# Patient Record
Sex: Male | Born: 1935 | Race: White | Hispanic: No | Marital: Married | Smoking: Former smoker
Health system: Southern US, Community
[De-identification: ages and names within clinical notes are randomized; demographics above are authoritative.]

## PROBLEM LIST (undated history)

## (undated) DIAGNOSIS — I639 Cerebral infarction, unspecified: Secondary | ICD-10-CM

## (undated) DIAGNOSIS — I480 Paroxysmal atrial fibrillation: Secondary | ICD-10-CM

## (undated) DIAGNOSIS — K219 Gastro-esophageal reflux disease without esophagitis: Secondary | ICD-10-CM

## (undated) DIAGNOSIS — I5032 Chronic diastolic (congestive) heart failure: Secondary | ICD-10-CM

## (undated) DIAGNOSIS — R1310 Dysphagia, unspecified: Secondary | ICD-10-CM

## (undated) DIAGNOSIS — I10 Essential (primary) hypertension: Secondary | ICD-10-CM

## (undated) DIAGNOSIS — E785 Hyperlipidemia, unspecified: Secondary | ICD-10-CM

## (undated) DIAGNOSIS — R319 Hematuria, unspecified: Secondary | ICD-10-CM

## (undated) HISTORY — DX: Dysphagia, unspecified: R13.10

## (undated) HISTORY — DX: Hematuria, unspecified: R31.9

## (undated) HISTORY — DX: Paroxysmal atrial fibrillation: I48.0

## (undated) HISTORY — DX: Chronic diastolic (congestive) heart failure: I50.32

## (undated) HISTORY — DX: Hyperlipidemia, unspecified: E78.5

---

## 2000-12-09 HISTORY — PX: HIP SURGERY: SHX245

## 2003-12-10 HISTORY — PX: SHOULDER SURGERY: SHX246

## 2005-04-30 ENCOUNTER — Ambulatory Visit: Payer: Self-pay | Admitting: Gastroenterology

## 2005-08-16 ENCOUNTER — Ambulatory Visit: Payer: Self-pay | Admitting: Family Medicine

## 2006-07-21 ENCOUNTER — Ambulatory Visit: Payer: Self-pay | Admitting: General Practice

## 2009-08-11 ENCOUNTER — Emergency Department: Payer: Self-pay | Admitting: Emergency Medicine

## 2009-08-12 ENCOUNTER — Emergency Department: Payer: Self-pay | Admitting: Internal Medicine

## 2012-10-21 ENCOUNTER — Emergency Department (HOSPITAL_COMMUNITY): Payer: PRIVATE HEALTH INSURANCE

## 2012-10-21 ENCOUNTER — Encounter (HOSPITAL_COMMUNITY): Payer: Self-pay

## 2012-10-21 ENCOUNTER — Inpatient Hospital Stay (HOSPITAL_COMMUNITY): Payer: PRIVATE HEALTH INSURANCE

## 2012-10-21 ENCOUNTER — Inpatient Hospital Stay (HOSPITAL_COMMUNITY)
Admission: EM | Admit: 2012-10-21 | Discharge: 2012-10-23 | DRG: 063 | Disposition: A | Discharge status: Home / Self Care | Payer: PRIVATE HEALTH INSURANCE | Attending: Neurology | Admitting: Neurology

## 2012-10-21 DIAGNOSIS — I6529 Occlusion and stenosis of unspecified carotid artery: Secondary | ICD-10-CM | POA: Diagnosis present

## 2012-10-21 DIAGNOSIS — I6350 Cerebral infarction due to unspecified occlusion or stenosis of unspecified cerebral artery: Principal | ICD-10-CM | POA: Diagnosis present

## 2012-10-21 DIAGNOSIS — R319 Hematuria, unspecified: Secondary | ICD-10-CM | POA: Diagnosis not present

## 2012-10-21 DIAGNOSIS — R1310 Dysphagia, unspecified: Secondary | ICD-10-CM | POA: Diagnosis present

## 2012-10-21 DIAGNOSIS — Z79899 Other long term (current) drug therapy: Secondary | ICD-10-CM

## 2012-10-21 DIAGNOSIS — R4701 Aphasia: Secondary | ICD-10-CM | POA: Diagnosis present

## 2012-10-21 DIAGNOSIS — R29810 Facial weakness: Secondary | ICD-10-CM | POA: Diagnosis present

## 2012-10-21 DIAGNOSIS — I10 Essential (primary) hypertension: Secondary | ICD-10-CM | POA: Diagnosis present

## 2012-10-21 DIAGNOSIS — Z96649 Presence of unspecified artificial hip joint: Secondary | ICD-10-CM

## 2012-10-21 DIAGNOSIS — K219 Gastro-esophageal reflux disease without esophagitis: Secondary | ICD-10-CM | POA: Diagnosis present

## 2012-10-21 DIAGNOSIS — I639 Cerebral infarction, unspecified: Secondary | ICD-10-CM | POA: Diagnosis present

## 2012-10-21 DIAGNOSIS — R911 Solitary pulmonary nodule: Secondary | ICD-10-CM | POA: Diagnosis present

## 2012-10-21 DIAGNOSIS — E785 Hyperlipidemia, unspecified: Secondary | ICD-10-CM | POA: Diagnosis present

## 2012-10-21 DIAGNOSIS — R4789 Other speech disturbances: Secondary | ICD-10-CM | POA: Diagnosis present

## 2012-10-21 HISTORY — DX: Essential (primary) hypertension: I10

## 2012-10-21 HISTORY — DX: Gastro-esophageal reflux disease without esophagitis: K21.9

## 2012-10-21 LAB — COMPREHENSIVE METABOLIC PANEL
ALT: 10 U/L (ref 0–53)
AST: 25 U/L (ref 0–37)
Albumin: 4.1 g/dL (ref 3.5–5.2)
Alkaline Phosphatase: 45 U/L (ref 39–117)
Potassium: 3.8 mEq/L (ref 3.5–5.1)
Sodium: 136 mEq/L (ref 135–145)
Total Protein: 7.3 g/dL (ref 6.0–8.3)

## 2012-10-21 LAB — DIFFERENTIAL
Basophils Relative: 1 % (ref 0–1)
Eosinophils Absolute: 0.2 10*3/uL (ref 0.0–0.7)
Neutrophils Relative %: 65 % (ref 43–77)

## 2012-10-21 LAB — POCT I-STAT, CHEM 8
BUN: 17 mg/dL (ref 6–23)
Calcium, Ion: 1.16 mmol/L (ref 1.13–1.30)
Chloride: 101 mEq/L (ref 96–112)
Glucose, Bld: 104 mg/dL — ABNORMAL HIGH (ref 70–99)

## 2012-10-21 LAB — CBC
MCH: 30.8 pg (ref 26.0–34.0)
MCHC: 34.8 g/dL (ref 30.0–36.0)
Platelets: 165 10*3/uL (ref 150–400)
RBC: 4.74 MIL/uL (ref 4.22–5.81)

## 2012-10-21 LAB — APTT: aPTT: 36 seconds (ref 24–37)

## 2012-10-21 LAB — PROTIME-INR
INR: 1.14 (ref 0.00–1.49)
Prothrombin Time: 14.4 seconds (ref 11.6–15.2)

## 2012-10-21 LAB — GLUCOSE, CAPILLARY: Glucose-Capillary: 102 mg/dL — ABNORMAL HIGH (ref 70–99)

## 2012-10-21 MED ORDER — ACETAMINOPHEN 325 MG PO TABS: 650.0000 mg | ORAL_TABLET | ORAL | Status: DC | PRN

## 2012-10-21 MED ORDER — SODIUM CHLORIDE 0.9 % IV SOLN
250.0000 mL | Freq: Once | INTRAVENOUS | Status: AC
  Administered 2012-10-21: 250 mL via INTRAVENOUS

## 2012-10-21 MED ORDER — PANTOPRAZOLE SODIUM 40 MG IV SOLR
40.0000 mg | Freq: Every day | INTRAVENOUS | Status: DC
  Administered 2012-10-21: 40 mg via INTRAVENOUS
  Filled 2012-10-21 (×2): qty 40

## 2012-10-21 MED ORDER — LABETALOL HCL 5 MG/ML IV SOLN
10.0000 mg | INTRAVENOUS | Status: DC | PRN
  Filled 2012-10-21: qty 4

## 2012-10-21 MED ORDER — DIPHENHYDRAMINE HCL 50 MG/ML IJ SOLN
50.0000 mg | Freq: Once | INTRAMUSCULAR | Status: AC
  Administered 2012-10-21: 50 mg via INTRAVENOUS

## 2012-10-21 MED ORDER — METHYLPREDNISOLONE SODIUM SUCC 125 MG IJ SOLR
INTRAMUSCULAR | Status: AC
  Filled 2012-10-21: qty 2

## 2012-10-21 MED ORDER — ALTEPLASE (STROKE) FULL DOSE INFUSION
0.9000 mg/kg | Freq: Once | INTRAVENOUS | Status: AC
  Administered 2012-10-21: 59 mg via INTRAVENOUS
  Filled 2012-10-21: qty 59

## 2012-10-21 MED ORDER — SENNOSIDES-DOCUSATE SODIUM 8.6-50 MG PO TABS
1.0000 | ORAL_TABLET | Freq: Every evening | ORAL | Status: DC | PRN
  Filled 2012-10-21: qty 1

## 2012-10-21 MED ORDER — SODIUM CHLORIDE 0.9 % IV SOLN
INTRAVENOUS | Status: DC
Start: 2012-10-21 — End: 2012-10-23
  Administered 2012-10-22: 1000 mL via INTRAVENOUS
  Administered 2012-10-23: 01:00:00 via INTRAVENOUS

## 2012-10-21 MED ORDER — DIPHENHYDRAMINE HCL 50 MG/ML IJ SOLN
INTRAMUSCULAR | Status: AC
  Filled 2012-10-21: qty 1

## 2012-10-21 MED ORDER — ACETAMINOPHEN 650 MG RE SUPP: 650.0000 mg | RECTAL | Status: DC | PRN

## 2012-10-21 MED ORDER — METHYLPREDNISOLONE SODIUM SUCC 125 MG IJ SOLR
125.0000 mg | Freq: Once | INTRAMUSCULAR | Status: AC
  Administered 2012-10-21: 125 mg via INTRAVENOUS

## 2012-10-21 MED ORDER — ONDANSETRON HCL 4 MG/2ML IJ SOLN: 4.0000 mg | Freq: Four times a day (QID) | INTRAMUSCULAR | Status: DC | PRN
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
## 2012-10-21 NOTE — Progress Notes (Signed)
*  PRELIMINARY RESULTS* Vascular Ultrasound Carotid Duplex (Doppler) has been completed.  There is evidence of elevated right internal carotid artery velocities in the upper 40-59% range of stenosis. The right vertebral artery is patent and demonstrates an atypical to-fro/ "bunny sign" waveform. The left internal carotid artery is highly tortuous with no obvious evidence of hemodynamically significant stenosis.  10/21/2012 7:03 PM Gertie Fey, RDMS, RDCS

## 2012-10-21 NOTE — Progress Notes (Signed)
RN bedside to empty and assess foley. Urine bag is filled with pink urine. MD notified of blood in urine. No new orders at this time.

## 2012-10-21 NOTE — ED Notes (Addendum)
tPa stopped with 7mL left d/t oral edema. 52mL of tPa administered.

## 2012-10-21 NOTE — ED Notes (Signed)
Tongue swelling noted, [place] [facility] notified, Dr. Roseanne Reno paged.
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
## 2012-10-22 ENCOUNTER — Inpatient Hospital Stay (HOSPITAL_COMMUNITY): Payer: PRIVATE HEALTH INSURANCE

## 2012-10-22 DIAGNOSIS — E785 Hyperlipidemia, unspecified: Secondary | ICD-10-CM | POA: Diagnosis present

## 2012-10-22 DIAGNOSIS — I639 Cerebral infarction, unspecified: Secondary | ICD-10-CM | POA: Diagnosis present

## 2012-10-22 DIAGNOSIS — I6789 Other cerebrovascular disease: Secondary | ICD-10-CM

## 2012-10-22 DIAGNOSIS — I6529 Occlusion and stenosis of unspecified carotid artery: Secondary | ICD-10-CM | POA: Diagnosis present

## 2012-10-22 DIAGNOSIS — I10 Essential (primary) hypertension: Secondary | ICD-10-CM | POA: Diagnosis present

## 2012-10-22 DIAGNOSIS — R319 Hematuria, unspecified: Secondary | ICD-10-CM | POA: Diagnosis not present

## 2012-10-22 DIAGNOSIS — R1310 Dysphagia, unspecified: Secondary | ICD-10-CM | POA: Diagnosis present

## 2012-10-22 DIAGNOSIS — R911 Solitary pulmonary nodule: Secondary | ICD-10-CM | POA: Diagnosis present

## 2012-10-22 DIAGNOSIS — I6350 Cerebral infarction due to unspecified occlusion or stenosis of unspecified cerebral artery: Principal | ICD-10-CM

## 2012-10-22 LAB — LIPID PANEL
HDL: 47 mg/dL (ref >39)
LDL Cholesterol: 105 mg/dL — ABNORMAL HIGH (ref 0–99)
Triglycerides: 51 mg/dL (ref <150)
VLDL: 10 mg/dL (ref 0–40)

## 2012-10-22 MED ORDER — SIMVASTATIN 10 MG PO TABS
10.0000 mg | ORAL_TABLET | Freq: Every day | ORAL | Status: DC
  Administered 2012-10-22: 10 mg via ORAL
  Filled 2012-10-22 (×2): qty 1

## 2012-10-22 MED ORDER — CLOPIDOGREL BISULFATE 75 MG PO TABS
75.0000 mg | ORAL_TABLET | Freq: Every day | ORAL | Status: DC
  Administered 2012-10-22 – 2012-10-23 (×2): 75 mg via ORAL
  Filled 2012-10-22 (×4): qty 1

## 2012-10-22 NOTE — Evaluation (Signed)
Physical Therapy Evaluation Patient Details Name: Norman Dickerson MRN: [id number] DOB: 10-02-1920 Today's Date: 10/22/2012 Time: 4098-1191 PT Time Calculation (min): 26 min  PT Assessment / Plan / Recommendation Clinical Impression  Pt is 78 y/o male admitted for right facial weakness and received tPA.  Pt with mild unsteady gait however pt fatigued from overall procedures today.  Noticeable slurred speech throughout session.  Pt will benefit from acute PT services to improve overall mobility, further balance and DME need assessment.  Pt overall fatigue on evaluation however willing to work.  Pt will benefit from acute PT services to improve overall mobility and prepare for safe d/c home with family.    PT Assessment  Patient needs continued PT services    Follow Up Recommendations  Home health PT;Supervision/Assistance - 24 hour (will continue to assess)    Does the patient have the potential to tolerate intense rehabilitation      Barriers to Discharge None      Equipment Recommendations   (will continue to assess may need RW intially)    Recommendations for Other Services     Frequency Min 4X/week    Precautions / Restrictions Precautions Precautions: Fall   Pertinent Vitals/Pain No c/o pain      Mobility  Bed Mobility Bed Mobility: Supine to Sit Supine to Sit: 4: Min guard;HOB flat;With rails Details for Bed Mobility Assistance: Minguard for safety Transfers Transfers: Sit to Stand;Stand to Sit;Stand Pivot Transfers Sit to Stand: 4: Min guard;From bed;From chair/3-in-1 Stand to Sit: 4: Min guard;To bed;To chair/3-in-1 Stand Pivot Transfers: 4: Min assist Details for Transfer Assistance: (A) with transfer to maintain balance.  Pt tends to be unsteady with steps with posterior lean. Ambulation/Gait Ambulation/Gait Assistance: 1: +2 Total assist Ambulation/Gait: Patient Percentage: 80% Ambulation Distance (Feet): 40 Feet Assistive device: 2 person hand held  assist Ambulation/Gait Assistance Details: +2 (A) for balance, safety and lines.  Pt unsteady with wide BOS initially.   Gait Pattern: Step-through pattern;Shuffle;Trunk flexed;Wide base of support Stairs: No    Shoulder Instructions     Exercises     PT Diagnosis: Difficulty walking;Abnormality of gait;Generalized weakness  PT Problem List: Decreased strength;Decreased balance;Decreased mobility;Decreased activity tolerance;Decreased knowledge of use of DME PT Treatment Interventions: DME instruction;Gait training;Stair training;Functional mobility training;Therapeutic activities;Therapeutic exercise;Balance training;Neuromuscular re-education;Patient/family education   PT Goals Acute Rehab PT Goals PT Goal Formulation: With patient/family Time For Goal Achievement: 10/29/12 Potential to Achieve Goals: Good Pt will go Supine/Side to Sit: with modified independence PT Goal: Supine/Side to Sit - Progress: Goal set today Pt will go Sit to Supine/Side: with modified independence PT Goal: Sit to Supine/Side - Progress: Goal set today Pt will go Sit to Stand: with modified independence PT Goal: Sit to Stand - Progress: Goal set today Pt will go Stand to Sit: with modified independence PT Goal: Stand to Sit - Progress: Goal set today Pt will Ambulate: >150 feet;with least restrictive assistive device;with modified independence PT Goal: Ambulate - Progress: Goal set today Pt will Go Up / Down Stairs: 1-2 stairs;with modified independence;with least restrictive assistive device PT Goal: Up/Down Stairs - Progress: Goal set today  Visit Information  Last PT Received On: 10/22/12 Assistance Needed: +1    Subjective Data  Subjective: "I'm pretty sleepy right now.: Patient Stated Goal: To go home with family   Prior Functioning  Home Living Lives With: Spouse Available Help at Discharge: Family;Available 24 hours/day Type of Home: House Home Access: Stairs to enter [employer] of Steps:  2 Entrance Stairs-Rails: None Home Layout: One level (with basement (pt walks down for pantry)) Bathroom Shower/Tub: Tub/shower unit;Door [employer] Toilet: Handicapped height Bathroom Accessibility: Yes How Accessible: Accessible via walker Home Adaptive Equipment: None (wife has equipment at home but pt has no equipment) Additional Comments: wife is there 24hr/day however only able to provide supervision.  Son present and stated "We all can work something out if we need more assistance." Prior Function Level of Independence: Independent Able to Take Stairs?: Yes Driving: Yes Vocation: Retired Musician:  (noticeable slurred speech) Dominant Hand: Right    Cognition  Overall Cognitive Status: Appears within functional limits for tasks assessed/performed Arousal/Alertness: Awake/alert (However noticeable a little drowsy) Orientation Level: Appears intact for tasks assessed Behavior During Session: [facility] for tasks performed    Extremity/Trunk Assessment Right Lower Extremity Assessment RLE ROM/Strength/Tone: Within functional levels Left Lower Extremity Assessment LLE ROM/Strength/Tone: Within functional levels   Balance Balance Balance Assessed: Yes Static Sitting Balance Static Sitting - Balance Support: Feet supported Static Sitting - Level of Assistance: 6: Modified independent (Device/Increase time)  End of Session PT - End of Session Equipment Utilized During Treatment: Gait belt Activity Tolerance: Patient tolerated treatment well Patient left: in chair;with call bell/phone within reach;with family/visitor present Nurse Communication: Mobility status  GP     Yi Falletta 10/22/2012, 12:20 PM [place], PT DPT [phone]

## 2012-10-22 NOTE — Procedures (Signed)
Objective Swallowing Evaluation: Modified Barium Swallowing Study  Patient Details  Name: Norman Dickerson MRN: [id number] Date of Birth: 06/18/1920  Today's Date: 10/22/2012 Time: 1035-1100 SLP Time Calculation (min): 25 min  Past Medical History:  Past Medical History  Diagnosis Date  . GERD (gastroesophageal reflux disease)   . Hypertension    Past Surgical History: History reviewed. No pertinent past surgical history. HPI:  78 y.o. male with history of GERD and HTN. Patient did not take his medication for his HTN this am.  He was driving with his wife today when at 12:00 noon his wife noted he had a right facial droop.  EMS was called and found patient dysarthric and notable right facial droop. NIHSS 3. CT of head negative. MRI pending.      Assessment / Plan / Recommendation Clinical Impression  Dysphagia Diagnosis: Moderate oral phase dysphagia;Moderate pharyngeal phase dysphagia;Severe pharyngeal phase dysphagia;Moderate cervical esophageal phase dysphagia Clinical impression: Patient presents with a moderate-severe oropharyngeal dysphagia with sensory, motor, and anatomical components. Overall function characterized by prolonged oral transit of solids, delayed swallow initiation, and oral, laryngeal, and pharyngeal weakness resulting in penetration of thin liquids during the swallow, and of all liquids post swallow due to pharyngeal residuals which are moderate-severe in nature. Cued hard throat clear and dry swallow was consistently effective to clear penetrates from the laryngeal vestibule, preventing aspiration during today's exam, although risk remains moderately high. Additionally, cannot f/u anatomical component to degree of pharyngeal residuals secondary to what appears to be osteophytes located along cervical spine beginning at C2-MD not present to confirm. ? some degree of pre-admission dysphagia which has now been exacerbated by acute weakness related to CVA.  Discussed in depth  with patient. Plan to continue a po diet, dysphagia 3, thin liquids, with strict aspiration precautions to decrease risk.  SLP will f/u closely.     Treatment Recommendation  Therapy as outlined in treatment plan below    Diet Recommendation Dysphagia 3 (Mechanical Soft);Thin liquid   Liquid Administration via: Cup;No straw Medication Administration: Crushed with puree Supervision: Patient able to self feed;Full supervision/cueing for compensatory strategies Compensations: Slow rate;Small sips/bites (throat clear and dry swallow after each bite/sip) Postural Changes and/or Swallow Maneuvers: Seated upright 90 degrees    Other  Recommendations Recommended Consults: MBS Oral Care Recommendations: Oral care BID   Follow Up Recommendations  Outpatient SLP    Frequency and Duration min 3x week  2 weeks   Pertinent Vitals/Pain n/a    SLP Swallow Goals Patient will utilize recommended strategies during swallow to increase swallowing safety with: Modified independent assistance Swallow Study Goal #2 - Progress: Not met   General HPI: 78 y.o. male with history of GERD and HTN. Patient did not take his medication for his HTN this am.  He was driving with his wife today when at 12:00 noon his wife noted he had a right facial droop.  EMS was called and found patient dysarthric and notable right facial droop. NIHSS 3. CT of head negative. MRI pending.  Type of Study: Modified Barium Swallowing Study Reason for Referral: Objectively evaluate swallowing function Previous Swallow Assessment: none; patient passed the RN stroke swallow screen however during cognitive-linguistic evaluation, vocal quality noted to be wet during am meal intake.  Diet Prior to this Study: Regular;Thin liquids Temperature Spikes Noted: No Respiratory Status: Room air History of Recent Intubation: No Behavior/Cognition: Alert;Cooperative;Pleasant mood Oral Cavity - Dentition: Adequate natural dentition Oral Motor /  Sensory Function: Impaired - see Bedside  swallow eval Self-Feeding Abilities: Able to feed self Patient Positioning: Upright in chair Baseline Vocal Quality: Wet Volitional Cough: Strong Volitional Swallow: Able to elicit Anatomy: Other (Comment) (? osteophytes from C2 down; MD not present to confirm) Pharyngeal Secretions: Not observed secondary MBS    Reason for Referral Objectively evaluate swallowing function   Oral Phase Oral Preparation/Oral Phase Oral Phase: Impaired Oral - Nectar Oral - Nectar Teaspoon: Within functional limits Oral - Nectar Cup: Within functional limits Oral - Thin Oral - Thin Cup: Within functional limits Oral - Solids Oral - Puree: Delayed oral transit Oral - Mechanical Soft: Delayed oral transit   Pharyngeal Phase Pharyngeal Phase Pharyngeal Phase: Impaired Pharyngeal - Nectar Pharyngeal - Nectar Teaspoon: Delayed swallow initiation;Premature spillage to pyriform sinuses;Reduced pharyngeal peristalsis;Reduced anterior laryngeal mobility;Reduced laryngeal elevation;Reduced airway/laryngeal closure;Reduced tongue base retraction;Penetration/Aspiration after swallow;Pharyngeal residue - valleculae;Pharyngeal residue - pyriform sinuses Penetration/Aspiration details (nectar teaspoon): Material enters airway, CONTACTS cords and not ejected out Pharyngeal - Nectar Cup: Delayed swallow initiation;Premature spillage to pyriform sinuses;Reduced pharyngeal peristalsis;Reduced anterior laryngeal mobility;Reduced laryngeal elevation;Reduced airway/laryngeal closure;Reduced tongue base retraction;Penetration/Aspiration after swallow;Pharyngeal residue - valleculae;Pharyngeal residue - pyriform sinuses Penetration/Aspiration details (nectar cup): Material enters airway, CONTACTS cords and not ejected out Pharyngeal - Thin Pharyngeal - Thin Cup: Delayed swallow initiation;Premature spillage to pyriform sinuses;Reduced pharyngeal peristalsis;Reduced anterior laryngeal  mobility;Reduced laryngeal elevation;Reduced airway/laryngeal closure;Reduced tongue base retraction;Penetration/Aspiration after swallow;Pharyngeal residue - valleculae;Pharyngeal residue - pyriform sinuses;Penetration/Aspiration during swallow Penetration/Aspiration details (thin cup): Material enters airway, remains ABOVE vocal cords and not ejected out Pharyngeal - Solids Pharyngeal - Puree: Delayed swallow initiation;Reduced pharyngeal peristalsis;Reduced anterior laryngeal mobility;Reduced laryngeal elevation;Reduced airway/laryngeal closure;Reduced tongue base retraction;Pharyngeal residue - valleculae;Pharyngeal residue - pyriform sinuses;Premature spillage to valleculae Penetration/Aspiration details (puree): Material does not enter airway Pharyngeal - Mechanical Soft: Delayed swallow initiation;Reduced pharyngeal peristalsis;Reduced anterior laryngeal mobility;Reduced laryngeal elevation;Reduced airway/laryngeal closure;Reduced tongue base retraction;Pharyngeal residue - valleculae;Pharyngeal residue - pyriform sinuses;Premature spillage to valleculae Penetration/Aspiration details (mechanical soft): Material does not enter airway  Cervical Esophageal Phase    GO    Cervical Esophageal Phase Cervical Esophageal Phase: Impaired Cervical Esophageal Phase - Nectar Nectar Teaspoon: Reduced cricopharyngeal relaxation Nectar Cup: Reduced cricopharyngeal relaxation Cervical Esophageal Phase - Thin Thin Cup: Reduced cricopharyngeal relaxation Cervical Esophageal Phase - Solids Puree: Reduced cricopharyngeal relaxation Mechanical Soft: Reduced cricopharyngeal relaxation Cervical Esophageal Phase - Comment Cervical Esophageal Comment: ? due to anatomical variations to cervical spine        [name] MA, CCC-SLP [phone]  Norman Dickerson 10/22/2012, 12:07 PM

## 2012-10-22 NOTE — Progress Notes (Signed)
OT Cancellation Note  Patient Details Name: Norman Dickerson MRN: [id number] DOB: 05/18/1920   Cancelled Treatment:     Pt declining OT evaluation today.  Report fatigue from multiple procedures today.  Will continue to follow.  Evern Bio 10/22/2012, 1:21 PM [id number]

## 2012-10-22 NOTE — Progress Notes (Signed)
Stroke Team Progress Note  HISTORY Norman Dickerson is an 78 y.o. male with history of GERD and HTN. Patient did not take his medication for his HTN this am. He was driving with his wife today 10/21/2012 when at 12:00 noon his wife noted he had a right facial droop. EMS was called and found patient dysarthric and notable right facial droop. BP was 190's systolically. PAtient was brought to [facility] hospital as a code stroke. While in the ED initially he scored a 1 for facial droop but then progressed to a 4 for facial droop, dysarthria and aphasia. tPa was ordered, administered and patient was admitted to 3100.  SUBJECTIVE His family is at the bedside.  Overall he feels his condition is stable/improved.   OBJECTIVE Most recent Vital Signs: Filed Vitals:   10/22/12 0400 10/22/12 0500 10/22/12 0600 10/22/12 0700  BP: 122/50 137/65 128/60 140/59  Pulse: 57 66 56 59  Temp: 97.9 F (36.6 C)     TempSrc: Oral     Resp: 13 15 16 15   Height:      Weight:      SpO2: 97% 97% 97% 95%   CBG (last 3)   Basename 10/21/12 1319  GLUCAP 102*    IV Fluid Intake:     . sodium chloride 75 mL/hr at 10/22/12 0700    MEDICATIONS    . [COMPLETED] sodium chloride  250 mL Intravenous Once  . [COMPLETED] alteplase  0.9 mg/kg Intravenous Once  . [COMPLETED] diphenhydrAMINE  50 mg Intravenous Once  . [COMPLETED] methylPREDNISolone (SOLU-MEDROL) injection  125 mg Intravenous Once  . pantoprazole (PROTONIX) IV  40 mg Intravenous QHS   PRN:  acetaminophen, acetaminophen, labetalol, ondansetron (ZOFRAN) IV, senna-docusate  Diet:  Cardiac thin liquids Activity:  Bedrest DVT Prophylaxis:  SCDs   CLINICALLY SIGNIFICANT STUDIES Basic Metabolic Panel:  Lab 10/21/12 1610 10/21/12 1252  NA 138 136  K 3.9 3.8  CL 101 98  CO2 -- 27  GLUCOSE 104* 100*  BUN 17 16  CREATININE 1.20 1.08  CALCIUM -- 9.5  MG -- --  PHOS -- --   Liver Function Tests:  Lab 10/21/12 1252  AST 25  ALT 10  ALKPHOS 45  BILITOT 0.7    PROT 7.3  ALBUMIN 4.1   CBC:  Lab 10/21/12 1307 10/21/12 1252  WBC -- 6.3  NEUTROABS -- 4.1  HGB 14.6 14.6  HCT 43.0 42.0  MCV -- 88.6  PLT -- 165   Coagulation:  Lab 10/21/12 1252  LABPROT 14.4  INR 1.14   Cardiac Enzymes:  Lab 10/21/12 1252  CKTOTAL --  CKMB --  CKMBINDEX --  TROPONINI <0.30   Urinalysis: No results found for this basename: COLORURINE:2,APPERANCEUR:2,LABSPEC:2,PHURINE:2,GLUCOSEU:2,HGBUR:2,BILIRUBINUR:2,KETONESUR:2,PROTEINUR:2,UROBILINOGEN:2,NITRITE:2,LEUKOCYTESUR:2 in the last 168 hours Lipid Panel    Component Value Date/Time   CHOL 162 10/22/2012 0510   TRIG 51 10/22/2012 0510   HDL 47 10/22/2012 0510   CHOLHDL 3.4 10/22/2012 0510   VLDL 10 10/22/2012 0510   LDLCALC 105* 10/22/2012 0510   HgbA1C  No results found for this basename: HGBA1C    Urine Drug Screen:   No results found for this basename: labopia, cocainscrnur, labbenz, amphetmu, thcu, labbarb    Alcohol Level: No results found for this basename: ETH:2 in the last 168 hours   CT of the brain  10/21/2012  No evidence of acute intracranial abnormality.  Atrophy with small vessel ischemic changes and intracranial atherosclerosis.   MRI of the brain    MRA of the  brain    2D Echocardiogram    Carotid Doppler  There is evidence of elevated right internal carotid artery velocities in the upper 40-59% range of stenosis. The right vertebral artery is patent and demonstrates an atypical to-fro/ "bunny sign" waveform. The left internal carotid artery is highly tortuous with no obvious evidence of hemodynamically significant stenosis.  CXR  10/21/2012  Nodular density projects over the right mid lung.  Cannot exclude pulmonary nodule.  Consider further evaluation with chest CT when the patient is clinically able.      EKG  normal EKG, normal sinus rhythm, unchanged from previous tracings, normal sinus rhythm, occasional PVC noted, unifocal.   Therapy Recommendations PT - ; OT -    Physical Exam   Pleasant elderly Caucasian male currently not in distress.Awake alert. Afebrile. Head is nontraumatic. Neck is supple without bruit. Hearing is normal. Cardiac exam no murmur or gallop. Lungs are clear to auscultation. Distal pulses are well felt.  Neurological Exam : Awake  Alert oriented x 3. Normal speech and language.eye movements full without nystagmus. Fundi were not visualized. Vision acuity and fields appear normal.. Face asymmetric with mild right lower face weakness.. Tongue midline. Normal strength, tone, reflexes and coordination. Normal sensation. Gait deferred.  ASSESSMENT [name] is a 78 y.o. male presenting with right facial droop, dysarthria and aphasia. Status post IV t-PA 10/21/2012 at 1332. Suspect left brain stroke. Imaging pending. Etiology unknown at this time. Work up underway. On aspirin 81 mg orally every day prior to admission. Now on no antiplatelets as within 24h of tPA for secondary stroke prevention.    Dysphagia, secondary to stroke, ST assessment pending  Hematuria  Possible pulmonary nodule on CXR  R ICA stenosis 40-59%  R VA tortuosity Hypertension, 192/95 on admission Hyperlipidemia, LDL 105, not on statin PTA, goal LDL < 100  Hospital day # 1  TREATMENT/PLAN  Add clopidogrel 75 mg orally every day for secondary stroke prevention 24h post tpa if imaging negative for hemorrhage.  MRI, MRA at 1p  OOB. Therapy evals  Transfer to the floor this afternoon   This patient is critically ill and at significant risk of neurological worsening, death and care requires constant monitoring of vital signs, hemodynamics,respiratory and cardiac monitoring,review of multiple databases, neurological assessment, discussion with family, other specialists and medical decision making of high complexity. I spent 30 minutes of neurocritical care time  in the care of  this patient.   Annie Main, MSN, RN, ANVP-BC, ANP-BC, [facility] Pager: [phone] 10/22/2012 8:04 AM  Scribe for Dr. Delia Heady, Stroke Center Medical Director, who has personally reviewed chart, pertinent data, examined the patient and developed the plan of care. Pager:  [phone]

## 2012-10-22 NOTE — Evaluation (Signed)
Speech Language Pathology Evaluation Patient Details Name: Norman Dickerson MRN: [id number] DOB: 1920-05-03 Today's Date: 10/22/2012 Time: 6213-0865 SLP Time Calculation (min): 20 min  Problem List: There is no problem list on file for this patient.  Past Medical History:  Past Medical History  Diagnosis Date  . GERD (gastroesophageal reflux disease)   . Hypertension    Past Surgical History: History reviewed. No pertinent past surgical history. HPI:  78 y.o. male with history of GERD and HTN. Patient did not take his medication for his HTN this am.  He was driving with his wife today when at 12:00 noon his wife noted he had a right facial droop.  EMS was called and found patient dysarthric and notable right facial droop. NIHSS 3. CT of head negative. MRI pending.    Assessment / Plan / Recommendation Clinical Impression  Cognitive-linguistic evaluation complete. Expressive and receptive communication skills appear to have returned to baseline with the exception of mild dyslexia at the sentence level. Patient receptive to SLP verbal cueing and able to self correct in 90% of opportunitites. No f/u SLP treatment recommended in the acute care setting as communication skills functional however recommend HH vs OP SLP f/u after d/c given high baseline cognitive function and independence prior to admission.     SLP Assessment  All further Speech Lanaguage Pathology  needs can be addressed in the next venue of care    Follow Up Recommendations  Outpatient SLP (vs HH if OP not a possibility)       Pertinent Vitals/Pain n/a   SLP Goals  SLP Goals Progress/Goals/Alternative treatment plan discussed with pt/caregiver and they: Agree  SLP Evaluation Prior Functioning  Cognitive/Linguistic Baseline: Within functional limits Lives With: Spouse Vocation: Retired   IT consultant  Overall Cognitive Status: Appears within functional limits for tasks assessed Orientation Level: Oriented X4      Comprehension  Auditory Comprehension Overall Auditory Comprehension: Appears within functional limits for tasks assessed Interfering Components: Hearing (HOH; requires occassional repetition of information) EffectiveTechniques: Repetition Visual Recognition/Discrimination Discrimination: Within Function Limits Reading Comprehension Reading Status: Impaired Word level: Within functional limits Sentence Level: Impaired Paragraph Level: Impaired Functional Environmental (signs, name badge): Within functional limits Effective Techniques: Visual cueing;Verbal cueing    Expression Expression Primary Mode of Expression: Verbal Verbal Expression Overall Verbal Expression: Appears within functional limits for tasks assessed Written Expression Dominant Hand: Right Written Expression: Not tested   Oral / Motor Oral Motor/Sensory Function Overall Oral Motor/Sensory Function: Impaired Labial ROM: Reduced right Labial Symmetry: Abnormal symmetry right Labial Strength: Reduced Labial Sensation: Within Functional Limits Lingual ROM: Within Functional Limits Lingual Symmetry: Within Functional Limits Lingual Strength: Within Functional Limits Lingual Sensation: Within Functional Limits Facial ROM: Reduced right Facial Symmetry: Right droop (slight) Facial Strength: Within Functional Limits Facial Sensation: Within Functional Limits Velum: Within Functional Limits Mandible: Within Functional Limits Motor Speech Overall Motor Speech: Appears within functional limits for tasks assessed   GO   [name] MA, CCC-SLP [phone]   [place] Doig [name] 10/22/2012, 9:22 AM

## 2012-10-22 NOTE — Progress Notes (Signed)
  Echocardiogram 2D Echocardiogram has been performed.  Georgian Co 10/22/2012, 4:46 PM

## 2012-10-22 NOTE — Evaluation (Signed)
Clinical/Bedside Swallow Evaluation Patient Details  Name: Norman Dickerson MRN: [id number] Date of Birth: 1920-10-31  Today's Date: 10/22/2012 Time: 0850-0905 SLP Time Calculation (min): 15 min  Past Medical History:  Past Medical History  Diagnosis Date  . GERD (gastroesophageal reflux disease)   . Hypertension    Past Surgical History: History reviewed. No pertinent past surgical history. HPI:  78 y.o. male with history of GERD and HTN. Patient did not take his medication for his HTN this am.  He was driving with his wife today when at 12:00 noon his wife noted he had a right facial droop.  EMS was called and found patient dysarthric and notable right facial droop. NIHSS 3. CT of head negative. MRI pending.    Assessment / Plan / Recommendation Clinical Impression  Patient presents with subtle s/s of aspiration characterized by intermittent wet vocal quality post swallow during am meal without patient awareness suggestive of penetration and/or aspiration. Given mild residual right sided facial droop due to acute CVA, recommend proceeding with MBS this am to ensure patient consuming least restrictive diet. Will continue current diet at this time. Educated patient and spouse regarding general safe swallowing precautions to utilize prior to exam. MBS scheduled for 1030.     Aspiration Risk  Moderate    Diet Recommendation Regular;Thin liquid   Liquid Administration via: Cup Medication Administration: Whole meds with liquid Supervision: Patient able to self feed;Intermittent supervision to cue for compensatory strategies Compensations: Slow rate;Small sips/bites Postural Changes and/or Swallow Maneuvers: Seated upright 90 degrees    Other  Recommendations Recommended Consults: MBS Oral Care Recommendations: Oral care BID   Follow Up Recommendations   (TBD for dysphagia pending MBS)       Pertinent Vitals/Pain n/a       Swallow Study Prior Functional Status  Cognitive/Linguistic Baseline: Within functional limits Lives 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 male with history of GERD and HTN. Patient did not take his medication for his HTN this am.  He was driving with his wife today when at 12:00 noon his wife noted he had a right facial droop.  EMS was called and found patient dysarthric and notable right facial droop. NIHSS 3. CT of head negative. MRI pending.  Type of Study: Bedside swallow evaluation Previous Swallow Assessment: none; patient passed the RN stroke swallow screen however during cognitive-linguistic evaluation, vocal quality noted to be wet during am meal intake.  Diet Prior to this Study: Regular;Thin liquids Temperature Spikes Noted: No Respiratory Status: Room air History of Recent Intubation: No Behavior/Cognition: Alert;Cooperative;Pleasant mood Oral Cavity - Dentition: Adequate natural dentition Self-Feeding Abilities: Able to feed self Patient Positioning: Upright in bed Baseline Vocal Quality: Wet (am meal initiated prior to SLP arrival to room) Volitional Cough: Strong Volitional Swallow: Able to elicit    Oral/Motor/Sensory Function Overall Oral Motor/Sensory Function: Impaired Labial ROM: Reduced right Labial Symmetry: Abnormal symmetry right Labial Strength: Reduced Labial Sensation: Within Functional Limits Lingual ROM: Within Functional Limits Lingual Symmetry: Within Functional Limits Lingual Strength: Within Functional Limits Lingual Sensation: Within Functional Limits Facial ROM: Reduced right Facial Symmetry: Right droop Facial Strength: Within Functional Limits Facial Sensation: Within Functional Limits Velum: Within Functional Limits Mandible: Within Functional Limits   Ice Chips Ice chips: Not tested   Thin Liquid Thin Liquid: Impaired Presentation: Cup Oral Phase Impairments: Reduced labial seal Oral Phase Functional Implications: Right anterior spillage Pharyngeal  Phase  Impairments: Wet Vocal Quality    Nectar Thick Nectar  Thick Liquid: Not tested   Honey Thick Honey Thick Liquid: Not tested   Puree Puree: Not tested   Solid   GO    Solid: Impaired Presentation: Self Fed;Spoon Oral Phase Impairments: Reduced lingual movement/coordination;Impaired anterior to posterior transit Oral Phase Functional Implications: Oral residue (slight) Pharyngeal Phase Impairments: Multiple swallows      Harbour Nordmeyer MA, CCC-SLP [phone]  Stephnie Parlier [name] 10/22/2012,9:31 AM

## 2012-10-22 NOTE — Progress Notes (Signed)
UR complete 

## 2012-10-23 DIAGNOSIS — R1310 Dysphagia, unspecified: Secondary | ICD-10-CM

## 2012-10-23 MED ORDER — CLOPIDOGREL BISULFATE 75 MG PO TABS: 75.0000 mg | ORAL_TABLET | Freq: Every day | ORAL | Status: DC

## 2012-10-23 MED ORDER — SIMVASTATIN 10 MG PO TABS: 10.0000 mg | ORAL_TABLET | Freq: Every day | ORAL | Status: DC

## 2012-10-23 NOTE — Care Management Note (Signed)
    Page 1 of 1   10/23/2012     4:17:43 PM   CARE MANAGEMENT NOTE 10/23/2012  Patient:  GIL, INGWERSEN   Account Number:  [id number]  Date Initiated:  10/23/2012  Documentation initiated by:  Jacquelynn Cree  Subjective/Objective Assessment:   Admitted with CVA, received TPA.     Action/Plan:   PT/OT/ST eval- recommending outpatient PT and ST   Anticipated DC Date:  10/23/2012   Anticipated DC Plan:  HOME/SELF CARE      DC Planning Services  CM consult  Outpatient Services - Pt will follow up      Choice offered to / List presented to:             Status of service:  Completed, signed off Medicare Important Message given?   (If response is "NO", the following Medicare IM given date fields will be blank) Date Medicare IM given:   Date Additional Medicare IM given:    Discharge Disposition:  HOME/SELF CARE  Per UR Regulation:  Reviewed for med. necessity/level of care/duration of stay  If discussed at Long Length of Stay Meetings, dates discussed:    Comments:  10/23/12 Spoke with patient, wife and sons about outpatient PT and ST. Gave them order for outpatient PT and ST and phone number for [facility]. Patient's wife stated that they had outpatient therapy previously and would probably use the same place. They will call to make an appt. They are also aware of need to call PCP and make f/u appt. Jacquelynn Cree RN, BSN, CCM

## 2012-10-23 NOTE — Progress Notes (Signed)
Stroke Team Progress Note  HISTORY Norman Dickerson is an 78 y.o. male with history of GERD and HTN. Patient did not take his medication for his HTN this am. He was driving with his wife today 10/21/2012 when at 12:00 noon his wife noted he had a right facial droop. EMS was called and found patient dysarthric and notable right facial droop. BP was 190's systolically. PAtient was brought to [facility] hospital as a code stroke. While in the ED initially he scored a 1 for facial droop but then progressed to a 4 for facial droop, dysarthria and aphasia. tPa was ordered, administered and patient was admitted to 3100.  SUBJECTIVE His family is at the bedside.  Overall he feels his condition is stable/improved.   OBJECTIVE Most recent Vital Signs: Filed Vitals:   10/22/12 1851 10/22/12 2236 10/23/12 0327 10/23/12 0550  BP: 120/55 118/70 121/66 118/64  Pulse: 62 66 70 68  Temp: 97.5 F (36.4 C) 98.9 F (37.2 C) 98.8 F (37.1 C) 98.2 F (36.8 C)  TempSrc: Oral Oral Oral Oral  Resp: 18 16 16 16   Height:      Weight:      SpO2: 97% 96% 98% 97%   CBG (last 3)   Basename 10/21/12 1319  GLUCAP 102*    IV Fluid Intake:      . sodium chloride 75 mL/hr at 10/23/12 0030    MEDICATIONS     . clopidogrel  75 mg Oral Q breakfast  . simvastatin  10 mg Oral q1800  . [DISCONTINUED] pantoprazole (PROTONIX) IV  40 mg Intravenous QHS   PRN:  acetaminophen, acetaminophen, labetalol, ondansetron (ZOFRAN) IV, senna-docusate  Diet:  Cardiac thin liquids Activity:  Activity as tolerated DVT Prophylaxis:  SCDs   CLINICALLY SIGNIFICANT STUDIES Basic Metabolic Panel:   Lab 10/21/12 1307 10/21/12 1252  NA 138 136  K 3.9 3.8  CL 101 98  CO2 -- 27  GLUCOSE 104* 100*  BUN 17 16  CREATININE 1.20 1.08  CALCIUM -- 9.5  MG -- --  PHOS -- --   Liver Function Tests:   Lab 10/21/12 1252  AST 25  ALT 10  ALKPHOS 45  BILITOT 0.7  PROT 7.3  ALBUMIN 4.1   CBC:   Lab 10/21/12 1307 10/21/12 1252  WBC --  6.3  NEUTROABS -- 4.1  HGB 14.6 14.6  HCT 43.0 42.0  MCV -- 88.6  PLT -- 165   Coagulation:   Lab 10/21/12 1252  LABPROT 14.4  INR 1.14   Cardiac Enzymes:   Lab 10/21/12 1252  CKTOTAL --  CKMB --  CKMBINDEX --  TROPONINI <0.30   Urinalysis: No results found for this basename: COLORURINE:2,APPERANCEUR:2,LABSPEC:2,PHURINE:2,GLUCOSEU:2,HGBUR:2,BILIRUBINUR:2,KETONESUR:2,PROTEINUR:2,UROBILINOGEN:2,NITRITE:2,LEUKOCYTESUR:2 in the last 168 hours Lipid Panel    Component Value Date/Time   CHOL 162 10/22/2012 0510   TRIG 51 10/22/2012 0510   HDL 47 10/22/2012 0510   CHOLHDL 3.4 10/22/2012 0510   VLDL 10 10/22/2012 0510   LDLCALC 105* 10/22/2012 0510   HgbA1C  Lab Results  Component Value Date   HGBA1C 5.2 10/22/2012    Urine Drug Screen:   No results found for this basename: labopia,  cocainscrnur,  labbenz,  amphetmu,  thcu,  labbarb    Alcohol Level: No results found for this basename: ETH:2 in the last 168 hours   CT of the brain  10/21/2012  No evidence of acute intracranial abnormality.  Atrophy with small vessel ischemic changes and intracranial atherosclerosis.   MRI of the brain  Atrophy and chronic microvascular ischemia. Small area of acute infarct left occipital parietal cortex. Patchy areas of acute infarct in the left insula and left frontal lobe. This is probably all in the left middle cerebral artery territory. No acute  hemorrhage  MRA of the brainNo significant intracranial stenosis. Decreased signal in the right vertebral artery which may indicate  the proximal flow limiting stenosis. There also appears to be a significant stenosis in the distal right vertebral artery    2D Echocardiogram  Left ventricle: The cavity size was normal. Wall thickness was normal. Systolic function was vigorous. The estimated ejection fraction was in the range of 65% to 70%. LA normal valve.  Carotid Doppler  There is evidence of elevated right internal carotid artery  velocities in the upper 40-59% range of stenosis. The right vertebral artery is patent and demonstrates an atypical to-fro/ "bunny sign" waveform. The left internal carotid artery is highly tortuous with no obvious evidence of hemodynamically significant stenosis.  CXR  10/21/2012  Nodular density projects over the right mid lung.  Cannot exclude pulmonary nodule.  Consider further evaluation with chest CT when the patient is clinically able.      EKG  normal EKG, normal sinus rhythm, unchanged from previous tracings, normal sinus rhythm, occasional PVC noted, unifocal.   Therapy Recommendations PT - ; OT - home health  Physical Exam   Pleasant elderly Caucasian male currently not in distress.Awake alert. Afebrile. Head is nontraumatic. Neck is supple without bruit. Hearing is normal. Cardiac exam no murmur or gallop. Lungs are clear to auscultation. Distal pulses are well felt.  Neurological Exam : Awake  Alert oriented x 3. Normal speech and language.eye movements full without nystagmus. Fundi were not visualized. Vision acuity and fields appear normal.. Face asymmetric with mild right lower face weakness.. Tongue midline. Normal strength, tone, reflexes and coordination. Normal sensation. Gait deferred.   ASSESSMENT [name] is a 78 y.o. male presenting with right facial droop, dysarthria and aphasia. Status post IV t-PA 10/21/2012 at 1332. Suspect left brain stroke. Imaging pending. Etiology unknown at this time. Work up underway. On aspirin 81 mg orally every day prior to admission. Now on no antiplatelets as within 24h of tPA for secondary stroke prevention.    Outpatient therapies planned  Possible pulmonary nodule on CXR, followup with primary physician in 1 week.  R ICA stenosis 40-59%  R VA tortuosity Hypertension, 192/95 on admission Hyperlipidemia, LDL 105, not on statin PTA, goal LDL < 100  Hospital day # 2  TREATMENT/PLAN  Add clopidogrel 75 mg orally every day for  secondary stroke prevention   Plan for discharge home today with home health  Follow up with Dr. Pearlean Brownie in 2 mos.  Follow up with Primary MD in 1 week  Guy Franco, [facility],  MBA, [facility] Pager: [phone] 10/23/2012 3:17 PM  Scribe for Dr. Delia Heady, Stroke Center Medical Director. He has personally reviewed chart, pertinent data, examined the patient and developed the plan of care. Pager:  [phone]

## 2012-10-23 NOTE — Progress Notes (Signed)
OT Cancellation Note  Patient Details Name: Norman Dickerson MRN: [id number] DOB: 07/01/1920   Cancelled Treatment:     In to see pt for OT eval and pt is currently being D/C'd by nursing who is in the room giving the patient and family D/C instructions.Eval not completed.  Evette Georges [phone] 10/23/2012, 3:23 PM

## 2012-10-23 NOTE — Progress Notes (Signed)
Physical Therapy Treatment Patient Details Name: Norman Dickerson MRN: [id number] DOB: [birth year] Today's Date: 10/23/2012 Time: 1478-2956 PT Time Calculation (min): 19 min  PT Assessment / Plan / Recommendation Comments on Treatment Session  Pt progressing well since yesterday. Pt still with high level balance deficits although is safe to d/c home with wife. Discussed with pt and family the need for close supervision initially upon d/c for safety.    Follow Up Recommendations  Outpatient PT;Supervision/Assistance - 24 hour     Does the patient have the potential to tolerate intense rehabilitation     Barriers to Discharge        Equipment Recommendations  None recommended by PT    Recommendations for Other Services    Frequency Min 4X/week   Plan Discharge plan remains appropriate;Frequency remains appropriate    Precautions / Restrictions Precautions Precautions: Fall Restrictions Weight Bearing Restrictions: No   Pertinent Vitals/Pain No pain complaints    Mobility  Bed Mobility Bed Mobility: Supine to Sit Supine to Sit: 6: Modified independent (Device/Increase time) Transfers Transfers: Sit to Stand;Stand to Sit Sit to Stand: 5: Supervision;With upper extremity assist;From bed Stand to Sit: 5: Supervision;With upper extremity assist;To chair/3-in-1 Details for Transfer Assistance: Supervision for safety only. No need for assistance Ambulation/Gait Ambulation/Gait Assistance: 4: Min guard;5: Supervision Ambulation Distance (Feet): 300 Feet Assistive device: Rolling walker;None Ambulation/Gait Assistance Details: Used RW in the beginning of the session, pt supervision only. Minguard during ambulation with no AD, especially increased during balance test. Pt with no loss of balance although required increased assistance during tandem walking Gait Pattern: Within Functional Limits Gait velocity: normal gait speed Stairs: No    Exercises     PT Diagnosis:    PT Problem  List:   PT Treatment Interventions:     PT Goals Acute Rehab PT Goals PT Goal: Supine/Side to Sit - Progress: Met PT Goal: Sit to Supine/Side - Progress: Met PT Goal: Sit to Stand - Progress: Progressing toward goal PT Goal: Stand to Sit - Progress: Progressing toward goal PT Goal: Ambulate - Progress: Progressing toward goal  Visit Information  Last PT Received On: 10/23/12 Assistance Needed: +1    Subjective Data      Cognition  Overall Cognitive Status: Appears within functional limits for tasks assessed/performed Arousal/Alertness: Awake/alert Orientation Level: Appears intact for tasks assessed Behavior During Session: [facility] for tasks performed    Balance  High Level Balance High Level Balance Activites: Other (comment);Head turns;Sudden stops;Direction changes;Backward walking;Side stepping;Braiding (tandem walking) High Level Balance Comments: Pt with good balance throughout all activities, although required min assist with tandem walking. Pt with decreased gait speed slightly during head turns and other cognitive tasks  End of Session PT - End of Session Equipment Utilized During Treatment: Gait belt Activity Tolerance: Patient tolerated treatment well Patient left: in chair;with call bell/phone within reach;with family/visitor present Nurse Communication: Mobility status   GP     Milana Kidney 10/23/2012, 1:52 PM

## 2012-10-23 NOTE — Progress Notes (Signed)
Speech Language Pathology Dysphagia Treatment Patient Details Name: Norman Dickerson MRN: [id number] DOB: 01-Oct-1920 Today's Date: 10/23/2012 Time: 6578-4696 SLP Time Calculation (min): 21 min  Assessment / Plan / Recommendation Clinical Impression  F/u diet tolerance assessment complete following MBS on 11/14. Patient required initial moderate verbal cueing for use of hard throat clear and dry swallow post swallow with all bites/sips, faded to min verbal cues as skilled observation of pm meal progressed. Wife present and supportive, able to demonstrate understanding of results of MBS and use of compensatory strategies by cueing patient appropriately through out pm meal. Intermittent wet vocal quality noted; likely penetration as noted in objective study which cleared as patient cleared throat. Educated patient on need to continue using this strategy strictly until advised by either this or a f/u SLP. Recommend repeat MBS when s/s of aspiration at bedside decrease. OP SLP after d/c recommended.     Diet Recommendation  Continue with Current Diet: Dysphagia 3 (mechanical soft);Thin liquid    SLP Plan Continue with current plan of care   Pertinent Vitals/Pain n/a   Swallowing Goals  SLP Swallowing Goals Patient will utilize recommended strategies during swallow to increase swallowing safety with: Modified independent assistance Swallow Study Goal #2 - Progress: Progressing toward goal  General Temperature Spikes Noted: No Respiratory Status: Room air Behavior/Cognition: Alert;Cooperative;Pleasant mood Oral Cavity - Dentition: Adequate natural dentition Patient Positioning: Upright in chair      Dysphagia Treatment Treatment focused on: Skilled observation of diet tolerance;Patient/family/caregiver education;Utilization of compensatory strategies Family/Caregiver Educated: spouse, children Treatment Methods/Modalities: Skilled observation Patient observed directly with PO's: Yes Type of  PO's observed: Dysphagia 3 (soft);Thin liquids Feeding: Able to feed self Liquids provided via: Cup Pharyngeal Phase Signs & Symptoms: Wet vocal quality Type of cueing: Verbal Amount of cueing: Minimal   GO   Norman Lango MA, CCC-SLP [phone]   Norman Dickerson [name] 10/23/2012, 2:50 PM

## 2012-10-23 NOTE — Progress Notes (Signed)
Received request for outpatient TEE and 3 week continuous heart monitor. Will send staff message to Daun Peacock to help arrange outpatient TEE. I called our office to arrange heart monitor and our office will be calling patient to arrange. Indication: Stroke, ordered by Dr. Pearlean Brownie per Guy Franco. Dayna Dunn PA-C

## 2012-10-23 NOTE — Discharge Summary (Signed)
Stroke Discharge Summary  Patient ID: Norman Dickerson   MRN: [id number]      DOB: 09-13-1920  Date of Admission: 10/21/2012 Date of Discharge: 10/23/2012  Attending Physician:  Darcella Cheshire, MD, Stroke MD  Consulting Physician(s):     None  Patient's PCP:  No primary provider on file.  Discharge Diagnoses:  Principal Problem:  *Stroke, left Active Problems:  Dysphagia  Hyperlipidemia LDL goal < 100  Hematuria  possible Pulmonary nodule  Hypertension  Internal carotid artery stenosis, right 40-59%  BMI: Body mass index is 23.39 kg/(m^2).  Past Medical History  Diagnosis Date  . GERD (gastroesophageal reflux disease)   . Hypertension    History reviewed. No pertinent past surgical history.    Medication List     As of 10/23/2012  2:48 PM    STOP taking these medications         aspirin EC 81 MG tablet      bisoprolol-hydrochlorothiazide 10-6.25 MG per tablet   Commonly known as: ZIAC      Iron Tabs      TAKE these medications         clopidogrel 75 MG tablet   Commonly known as: PLAVIX   Take 1 tablet (75 mg total) by mouth daily with breakfast.      omeprazole 20 MG tablet   Commonly known as: PRILOSEC OTC   Take 20 mg by mouth daily.      simvastatin 10 MG tablet   Commonly known as: ZOCOR   Take 1 tablet (10 mg total) by mouth daily at 6 PM.        LABORATORY STUDIES CBC    Component Value Date/Time   WBC 6.3 10/21/2012 1252   RBC 4.74 10/21/2012 1252   HGB 14.6 10/21/2012 1307   HCT 43.0 10/21/2012 1307   PLT 165 10/21/2012 1252   MCV 88.6 10/21/2012 1252   MCH 30.8 10/21/2012 1252   MCHC 34.8 10/21/2012 1252   RDW 13.0 10/21/2012 1252   LYMPHSABS 1.3 10/21/2012 1252   MONOABS 0.7 10/21/2012 1252   EOSABS 0.2 10/21/2012 1252   BASOSABS 0.0 10/21/2012 1252   CMP    Component Value Date/Time   NA 138 10/21/2012 1307   K 3.9 10/21/2012 1307   CL 101 10/21/2012 1307   CO2 27 10/21/2012 1252   GLUCOSE 104* 10/21/2012 1307   BUN  17 10/21/2012 1307   CREATININE 1.20 10/21/2012 1307   CALCIUM 9.5 10/21/2012 1252   PROT 7.3 10/21/2012 1252   ALBUMIN 4.1 10/21/2012 1252   AST 25 10/21/2012 1252   ALT 10 10/21/2012 1252   ALKPHOS 45 10/21/2012 1252   BILITOT 0.7 10/21/2012 1252   GFRNONAA 59* 10/21/2012 1252   GFRAA 68* 10/21/2012 1252   COAGS Lab Results  Component Value Date   INR 1.14 10/21/2012   Lipid Panel    Component Value Date/Time   CHOL 162 10/22/2012 0510   TRIG 51 10/22/2012 0510   HDL 47 10/22/2012 0510   CHOLHDL 3.4 10/22/2012 0510   VLDL 10 10/22/2012 0510   LDLCALC 105* 10/22/2012 0510   HgbA1C  Lab Results  Component Value Date   HGBA1C 5.2 10/22/2012   Cardiac Panel (last 3 results)  Basename 10/21/12 1252  CKTOTAL --  CKMB --  TROPONINI <0.30  RELINDX --   Urinalysis No results found for this basename: colorurine, appearanceur, labspec, phurine, glucoseu, hgbur, bilirubinur, ketonesur, proteinur, urobilinogen, nitrite, leukocytesur   Urine Drug  Screen  No results found for this basename: labopia, cocainscrnur, labbenz, amphetmu, thcu, labbarb    Alcohol Level No results found for this basename: eth    SIGNIFICANT DIAGNOSTIC STUDIES  CT of the brain 10/21/2012 No evidence of acute intracranial abnormality. Atrophy with small vessel ischemic changes and intracranial atherosclerosis.   MRI of the brain Atrophy and chronic microvascular ischemia. Small area of acute infarct left occipital parietal cortex. Patchy areas of acute infarct in the left insula and left frontal lobe. This is probably all in the left middle cerebral artery territory. No acute  hemorrhage   MRA of the brainNo significant intracranial stenosis. Decreased signal in the right vertebral artery which may indicate  the proximal flow limiting stenosis. There also appears to be a significant stenosis in the distal right vertebral artery   2D Echocardiogram EF 60% normal LA size  Carotid Doppler There is  evidence of elevated right internal carotid artery velocities in the upper 40-59% range of stenosis. The right vertebral artery is patent and demonstrates an atypical to-fro/ "bunny sign" waveform. The left internal carotid artery is highly tortuous with no obvious evidence of hemodynamically significant stenosis.   CXR 10/21/2012 Nodular density projects over the right mid lung. Cannot exclude pulmonary nodule. Consider further evaluation with chest CT when the patient is clinically able.   EKG normal EKG, normal sinus rhythm, unchanged from previous tracings, normal sinus rhythm, occasional PVC noted, unifocal.     History of Present Illness    Norman Dickerson is an 78 y.o. male with history of GERD and HTN. Patient did not take his medication for his HTN this am. He was driving with his wife 40/[id number]/1191 when at 12:00 noon his wife noted he had a right facial droop. EMS was called and found patient dysarthric and notable right facial droop. BP was 190's systolically. PAtient was brought to [facility] hospital as a code stroke. While in the ED initially he scored a 1 for facial droop but then progressed to a 4 for facial droop, dysarthria and aphasia. tPa was ordered, administered and patient was admitted to 3100.  Hospital Course   [name] is a 78 y.o. male presenting with right facial droop, dysarthria and aphasia. Status post IV t-PA 10/21/2012 at 1332. Suspect left brain stroke. Imaging pending. Etiology unknown at this time. Work up underway. On aspirin 81 mg orally every day prior to admission. Now on no antiplatelets as within 24h of tPA for secondary stroke prevention. Repeat MRI did not show hemorrhage and he was started on plavix 75mg  daily for secondary stroke prevention.   He was noted to have a questionable pulmonary nodule on CXR and we will refer him to his primary MD for further workup.  Patient has stroke risk factors of hypertension, hyperlipidemina  Patient with continued stroke  symptoms of right facial droop, dysarthria, aphasia Physical therapy, occupational therapy and speech therapy evaluated patient. They recommend outpatient therapy.   Discharge Exam  Blood pressure 125/61, pulse 54, temperature 98.4 F (36.9 C), temperature source Oral, resp. rate 18, height 5\' 6"  (1.676 m), weight 65.72 kg (144 lb 14.2 oz), SpO2 98.00%.   Physical Exam  Pleasant elderly Caucasian male currently not in distress.Awake alert. Afebrile. Head is nontraumatic. Neck is supple without bruit. Hearing is normal. Cardiac exam no murmur or gallop. Lungs are clear to auscultation. Distal pulses are well felt.  Neurological Exam : Awake Alert oriented x 3. Normal speech and language.eye movements full without nystagmus. Fundi  were not visualized. Vision acuity and fields appear normal.. Face asymmetric with mild right lower face weakness.. Tongue midline.  Normal strength, tone, reflexes and coordination. Normal sensation. Gait deferred.   Discharge Diet   Cardiac thinliquids  Discharge Plan    Disposition:  Home  Plavix 75mg  daily for secondary stroke prevention.  Ongoing risk factor control by Primary Care Physician.  Risk factor recommendations:  {Hypertension target range 130-140/70-80  Lipid range - LDL < 100 and checked every 6 months, fasting  Diabetes - HgB A1C <7  Follow-up Dr. Judithann Sheen in 1 week.  Follow-up with Dr. Delia Heady in 2 months.  TEE and outpatient telemetry will be set up.  Signed  Guy Franco, PAC,  MBA, [facility] Pager: [phone] 10/23/2012 3:09 PM  Scribe for Dr. Delia Heady, Stroke Center Medical Director. He has personally reviewed chart, pertinent data, examined the patient and developed the plan of care. Pager:  [phone]

## 2012-10-27 ENCOUNTER — Encounter: Payer: Self-pay | Admitting: Internal Medicine

## 2012-10-27 ENCOUNTER — Encounter (INDEPENDENT_AMBULATORY_CARE_PROVIDER_SITE_OTHER): Payer: PRIVATE HEALTH INSURANCE

## 2012-10-27 DIAGNOSIS — R002 Palpitations: Secondary | ICD-10-CM

## 2012-10-29 ENCOUNTER — Encounter (HOSPITAL_COMMUNITY)
Admission: RE | Disposition: A | Discharge status: Home / Self Care | Payer: Self-pay | Source: Ambulatory Visit | Attending: Internal Medicine

## 2012-10-29 ENCOUNTER — Encounter (HOSPITAL_COMMUNITY): Payer: Self-pay

## 2012-10-29 ENCOUNTER — Ambulatory Visit (HOSPITAL_COMMUNITY)
Admission: RE | Admit: 2012-10-29 | Discharge: 2012-10-29 | Disposition: A | Discharge status: Home / Self Care | Payer: PRIVATE HEALTH INSURANCE | Source: Ambulatory Visit | Attending: Internal Medicine | Admitting: Internal Medicine

## 2012-10-29 DIAGNOSIS — Z538 Procedure and treatment not carried out for other reasons: Secondary | ICD-10-CM | POA: Insufficient documentation

## 2012-10-29 DIAGNOSIS — I10 Essential (primary) hypertension: Secondary | ICD-10-CM | POA: Insufficient documentation

## 2012-10-29 DIAGNOSIS — K222 Esophageal obstruction: Secondary | ICD-10-CM | POA: Insufficient documentation

## 2012-10-29 DIAGNOSIS — K219 Gastro-esophageal reflux disease without esophagitis: Secondary | ICD-10-CM | POA: Insufficient documentation

## 2012-10-29 DIAGNOSIS — I6990 Unspecified sequelae of unspecified cerebrovascular disease: Secondary | ICD-10-CM | POA: Insufficient documentation

## 2012-10-29 HISTORY — PX: TEE WITHOUT CARDIOVERSION: SHX5443

## 2012-10-29 SURGERY — ECHOCARDIOGRAM, TRANSESOPHAGEAL
Anesthesia: Moderate Sedation

## 2012-10-29 MED ORDER — LIDOCAINE VISCOUS 2 % MT SOLN
OROMUCOSAL | Status: AC
  Filled 2012-10-29: qty 30

## 2012-10-29 MED ORDER — FENTANYL CITRATE 0.05 MG/ML IJ SOLN
INTRAMUSCULAR | Status: AC
  Filled 2012-10-29: qty 4

## 2012-10-29 MED ORDER — MIDAZOLAM HCL 5 MG/ML IJ SOLN
INTRAMUSCULAR | Status: AC
  Filled 2012-10-29: qty 2

## 2012-10-29 MED ORDER — SODIUM CHLORIDE 0.9 % IV SOLN
INTRAVENOUS | Status: DC
  Administered 2012-10-29: 500 mL via INTRAVENOUS

## 2012-10-29 MED ORDER — DIPHENHYDRAMINE HCL 50 MG/ML IJ SOLN
INTRAMUSCULAR | Status: DC | PRN
  Administered 2012-10-29: 25 mg via INTRAVENOUS
  Administered 2012-10-29: 12.5 mg via INTRAVENOUS

## 2012-10-29 MED ORDER — LIDOCAINE VISCOUS 2 % MT SOLN
OROMUCOSAL | Status: DC | PRN
  Administered 2012-10-29: 10 mL via OROMUCOSAL

## 2012-10-29 MED ORDER — MIDAZOLAM HCL 10 MG/2ML IJ SOLN
INTRAMUSCULAR | Status: DC | PRN
  Administered 2012-10-29 (×2): 2 mg via INTRAVENOUS
  Administered 2012-10-29: 1 mg via INTRAVENOUS
  Administered 2012-10-29: 2 mg via INTRAVENOUS

## 2012-10-29 MED ORDER — DIPHENHYDRAMINE HCL 50 MG/ML IJ SOLN
INTRAMUSCULAR | Status: AC
  Filled 2012-10-29: qty 1

## 2012-10-29 MED ORDER — FENTANYL CITRATE 0.05 MG/ML IJ SOLN
INTRAMUSCULAR | Status: DC | PRN
  Administered 2012-10-29 (×3): 25 ug via INTRAVENOUS

## 2012-10-29 NOTE — H&P (View-Only) (Signed)
Stroke Team Progress Note  HISTORY Norman Dickerson is an 78 y.o. male with history of GERD and HTN. Patient did not take his medication for his HTN this am. He was driving with his wife today 10/21/2012 when at 12:00 noon his wife noted he had a right facial droop. EMS was called and found patient dysarthric and notable right facial droop. BP was 190's systolically. PAtient was brought to [place] as a code stroke. While in the ED initially he scored a 1 for facial droop but then progressed to a 4 for facial droop, dysarthria and aphasia. tPa was ordered, administered and patient was admitted to 3100.  SUBJECTIVE His family is at the bedside.  Overall he feels his condition is stable/improved.   OBJECTIVE Most recent Vital Signs: Filed Vitals:   10/22/12 1851 10/22/12 2236 10/23/12 0327 10/23/12 0550  BP: 120/55 118/70 121/66 118/64  Pulse: 62 66 70 68  Temp: 97.5 F (36.4 C) 98.9 F (37.2 C) 98.8 F (37.1 C) 98.2 F (36.8 C)  TempSrc: Oral Oral Oral Oral  Resp: 18 16 16 16  Height:      Weight:      SpO2: 97% 96% 98% 97%   CBG (last 3)   Basename 10/21/12 1319  GLUCAP 102*    IV Fluid Intake:      . sodium chloride 75 mL/hr at 10/23/12 0030    MEDICATIONS     . clopidogrel  75 mg Oral Q breakfast  . simvastatin  10 mg Oral q1800  . [DISCONTINUED] pantoprazole (PROTONIX) IV  40 mg Intravenous QHS   PRN:  acetaminophen, acetaminophen, labetalol, ondansetron (ZOFRAN) IV, senna-docusate  Diet:  Cardiac thin liquids Activity:  Activity as tolerated DVT Prophylaxis:  SCDs   CLINICALLY SIGNIFICANT STUDIES Basic Metabolic Panel:   Lab 10/21/12 1307 10/21/12 1252  NA 138 136  K 3.9 3.8  CL 101 98  CO2 -- 27  GLUCOSE 104* 100*  BUN 17 16  CREATININE 1.20 1.08  CALCIUM -- 9.5  MG -- --  PHOS -- --   Liver Function Tests:   Lab 10/21/12 1252  AST 25  ALT 10  ALKPHOS 45  BILITOT 0.7  PROT 7.3  ALBUMIN 4.1   CBC:   Lab 10/21/12 1307 10/21/12 1252  WBC --  6.3  NEUTROABS -- 4.1  HGB 14.6 14.6  HCT 43.0 42.0  MCV -- 88.6  PLT -- 165   Coagulation:   Lab 10/21/12 1252  LABPROT 14.4  INR 1.14   Cardiac Enzymes:   Lab 10/21/12 1252  CKTOTAL --  CKMB --  CKMBINDEX --  TROPONINI <0.30   Urinalysis: No results found for this basename: COLORURINE:2,APPERANCEUR:2,LABSPEC:2,PHURINE:2,GLUCOSEU:2,HGBUR:2,BILIRUBINUR:2,KETONESUR:2,PROTEINUR:2,UROBILINOGEN:2,NITRITE:2,LEUKOCYTESUR:2 in the last 168 hours Lipid Panel    Component Value Date/Time   CHOL 162 10/22/2012 0510   TRIG 51 10/22/2012 0510   HDL 47 10/22/2012 0510   CHOLHDL 3.4 10/22/2012 0510   VLDL 10 10/22/2012 0510   LDLCALC 105* 10/22/2012 0510   HgbA1C  Lab Results  Component Value Date   HGBA1C 5.2 10/22/2012    Urine Drug Screen:   No results found for this basename: labopia,  cocainscrnur,  labbenz,  amphetmu,  thcu,  labbarb    Alcohol Level: No results found for this basename: ETH:2 in the last 168 hours   CT of the brain  10/21/2012  No evidence of acute intracranial abnormality.  Atrophy with small vessel ischemic changes and intracranial atherosclerosis.   MRI of the brain    Atrophy and chronic microvascular ischemia. Small area of acute infarct left occipital parietal cortex. Patchy areas of acute infarct in the left insula and left frontal lobe. This is probably all in the left middle cerebral artery territory. No acute  hemorrhage  MRA of the brainNo significant intracranial stenosis. Decreased signal in the right vertebral artery which may indicate  the proximal flow limiting stenosis. There also appears to be a significant stenosis in the distal right vertebral artery    2D Echocardiogram  Left ventricle: The cavity size was normal. Wall thickness was normal. Systolic function was vigorous. The estimated ejection fraction was in the range of 65% to 70%. LA normal valve.  Carotid Doppler  There is evidence of elevated right internal carotid artery  velocities in the upper 40-59% range of stenosis. The right vertebral artery is patent and demonstrates an atypical to-fro/ "bunny sign" waveform. The left internal carotid artery is highly tortuous with no obvious evidence of hemodynamically significant stenosis.  CXR  10/21/2012  Nodular density projects over the right mid lung.  Cannot exclude pulmonary nodule.  Consider further evaluation with chest CT when the patient is clinically able.      EKG  normal EKG, normal sinus rhythm, unchanged from previous tracings, normal sinus rhythm, occasional PVC noted, unifocal.   Therapy Recommendations PT - ; OT - home health  Physical Exam   Pleasant elderly Caucasian male currently not in distress.Awake alert. Afebrile. Head is nontraumatic. Neck is supple without bruit. Hearing is normal. Cardiac exam no murmur or gallop. Lungs are clear to auscultation. Distal pulses are well felt.  Neurological Exam : Awake  Alert oriented x 3. Normal speech and language.eye movements full without nystagmus. Fundi were not visualized. Vision acuity and fields appear normal.. Face asymmetric with mild right lower face weakness.. Tongue midline. Normal strength, tone, reflexes and coordination. Normal sensation. Gait deferred.   ASSESSMENT [name] is a 78 y.o. male presenting with right facial droop, dysarthria and aphasia. Status post IV t-PA 10/21/2012 at 1332. Suspect left brain stroke. Imaging pending. Etiology unknown at this time. Work up underway. On aspirin 81 mg orally every day prior to admission. Now on no antiplatelets as within 24h of tPA for secondary stroke prevention.    Outpatient therapies planned  Possible pulmonary nodule on CXR, followup with primary physician in 1 week.  R ICA stenosis 40-59%  R VA tortuosity Hypertension, 192/95 on admission Hyperlipidemia, LDL 105, not on statin PTA, goal LDL < 100  Hospital day # 2  TREATMENT/PLAN  Add clopidogrel 75 mg orally every day for  secondary stroke prevention   Plan for discharge home today with home health  Follow up with Dr. Angelea Penny in 2 mos.  Follow up with Primary MD in 1 week  Lynn Brown, PAC,  MBA, MHA [facility] Pager: [phone] 10/23/2012 3:17 PM  Scribe for Dr. Harlynn Kimbell, Stroke Center Medical Director. He has personally reviewed chart, pertinent data, examined the patient and developed the plan of care. Pager:  [phone]            

## 2012-10-29 NOTE — Op Note (Signed)
Unable to pass probe into esophagus  Met resistance at upper esophageal sphincter.  GI assist also unsuccessful Patients oxygen sats down to 93%  Procedure aborted.  Consider swallowing evaluation.

## 2012-10-29 NOTE — Interval H&P Note (Signed)
History and Physical Interval Note:  10/29/2012 10:44 AM  Norman Dickerson  has presented today for surgery, with the diagnosis of cva  The various methods of treatment have been discussed with the patient and family. After consideration of risks, benefits and other options for treatment, the patient has consented to  Procedure(s) (LRB) with comments: TRANSESOPHAGEAL ECHOCARDIOGRAM (TEE) (N/A) as a surgical intervention .  The patient's history has been reviewed, patient examined, no change in status, stable for surgery.  I have reviewed the patient's chart and labs.  Questions were answered to the patient's satisfaction.     Dietrich Pates

## 2012-10-30 ENCOUNTER — Encounter (HOSPITAL_COMMUNITY): Payer: Self-pay | Admitting: Internal Medicine

## 2012-11-01 ENCOUNTER — Emergency Department: Payer: Self-pay | Admitting: Emergency Medicine

## 2012-11-01 LAB — CBC
HCT: 41.7 % (ref 40.0–52.0)
HGB: 14.4 g/dL (ref 13.0–18.0)
MCHC: 34.5 g/dL (ref 32.0–36.0)
MCV: 91 fL (ref 80–100)
Platelet: 167 10*3/uL (ref 150–440)
RBC: 4.58 10*6/uL (ref 4.40–5.90)

## 2012-11-01 LAB — COMPREHENSIVE METABOLIC PANEL
Alkaline Phosphatase: 56 U/L (ref 50–136)
Anion Gap: 4 — ABNORMAL LOW (ref 7–16)
Bilirubin,Total: 1.2 mg/dL — ABNORMAL HIGH (ref 0.2–1.0)
Calcium, Total: 9 mg/dL (ref 8.5–10.1)
Co2: 29 mmol/L (ref 21–32)
Creatinine: 0.93 mg/dL (ref 0.60–1.30)
EGFR (African American): >60
EGFR (Non-African Amer.): >60
Osmolality: 273 (ref 275–301)
Potassium: 4.1 mmol/L (ref 3.5–5.1)
SGOT(AST): 23 U/L (ref 15–37)
Sodium: 136 mmol/L (ref 136–145)

## 2012-11-01 LAB — CK TOTAL AND CKMB (NOT AT ARMC)
CK, Total: 44 U/L (ref 35–232)
CK-MB: <0.5 ng/mL — ABNORMAL LOW (ref 0.5–3.6)

## 2012-11-01 LAB — TROPONIN I: Troponin-I: <0.02 ng/mL

## 2012-11-07 LAB — CULTURE, BLOOD (SINGLE)

## 2012-11-13 ENCOUNTER — Ambulatory Visit: Payer: Self-pay | Admitting: Internal Medicine

## 2012-12-14 ENCOUNTER — Telehealth: Payer: Self-pay

## 2012-12-14 NOTE — Telephone Encounter (Signed)
Received call from Dr.Ross stating she did a TEE on patient.Patient has atrial fib needs to be started on coumadin.Patient needs to be seen tomorrow 12/15/12 by DOD Dr.McLean.Message sent to Dr.McLean's nurse.

## 2012-12-15 ENCOUNTER — Telehealth: Payer: Self-pay | Admitting: Cardiology

## 2012-12-15 NOTE — Telephone Encounter (Signed)
New Problem:    I attempted to call the patient on both their home [phone]) and mobile [phone]) numbers, was unable to reach them and was unable to leave a message. I called the patient's emergency contact and was unable to reach them as well.  I left a message on their voicemail with my name, the reason I called, the name of their physician, and a number to call back to schedule his father's appointment today at 12:00 pm.

## 2012-12-15 NOTE — Telephone Encounter (Signed)
Can try to fit him in the afternoon at some point.

## 2012-12-15 NOTE — Telephone Encounter (Signed)
I will forward to Dr McLean for review 

## 2012-12-17 NOTE — Telephone Encounter (Signed)
See phone note dated 12/15/12

## 2012-12-21 ENCOUNTER — Encounter: Payer: Self-pay | Admitting: Internal Medicine

## 2012-12-21 ENCOUNTER — Ambulatory Visit (INDEPENDENT_AMBULATORY_CARE_PROVIDER_SITE_OTHER): Payer: Medicare Other | Admitting: Internal Medicine

## 2012-12-21 VITALS — BP 170/79 | HR 74 | Ht 65.0 in | Wt 136.0 lb

## 2012-12-21 DIAGNOSIS — I4891 Unspecified atrial fibrillation: Secondary | ICD-10-CM

## 2012-12-21 MED ORDER — WARFARIN SODIUM 5 MG PO TABS: 5.0000 mg | ORAL_TABLET | Freq: Every day | ORAL | Status: DC

## 2012-12-21 NOTE — Patient Instructions (Addendum)
STOP Plavix  START Coumadin.  Appointment with Coumadin clinic on 1/17.  Follow up with Dr. Kirke Corin or Dr.Gollan in MAY 2014 in [place]

## 2012-12-21 NOTE — Progress Notes (Signed)
HPI Patient is a 79 yo who is referred for evaluation of atrial fibrillation.  The patient was recently admitted to [facility] with a CVA.  I met him when I attempted a TEE  Unable to pass probe.  The patient was d/c'd home on Plavix and ASA> After going home he was set up for a holter monitor.  He wore this and he had atrial fibrillation. He presents for eval ON talking to the patient he has had palpitations in the past He was nver diagonsed with an arrhythmiaNo Known Allergies Current Outpatient Prescriptions  Medication Sig Dispense Refill  . clopidogrel (PLAVIX) 75 MG tablet Take 1 tablet (75 mg total) by mouth daily with breakfast.  30 tablet  2  . simvastatin (ZOCOR) 10 MG tablet Take 1 tablet (10 mg total) by mouth daily at 6 PM.  30 tablet  2    Past Medical History  Diagnosis Date  . GERD (gastroesophageal reflux disease)   . Hypertension     Past Surgical History  Procedure Date  . Hip surgery 2002  . Shoulder surgery 2005  . Tee without cardioversion 10/29/2012    Procedure: TRANSESOPHAGEAL ECHOCARDIOGRAM (TEE);  Surgeon: Pricilla Riffle, MD;  Location: [facility] ENDOSCOPY;  Service: Cardiovascular;  Laterality: N/A;    No family history on file.  History   Social History  . Marital Status: Married    Spouse Name: N/A    Number of Children: N/A  . Years of Education: N/A   Occupational History  . Not on file.   Social History Main Topics  . Smoking status: Former Smoker    Quit date: 12/21/1933  . Smokeless tobacco: Not on file  . Alcohol Use: No  . Drug Use: Not on file  . Sexually Active: Not on file   Other Topics Concern  . Not on file   Social History Narrative  . No narrative on file    Review of Systems:  All systems reviewed.  They are negative to the above problem except as previously stated.  Vital Signs: BP 170/79  Pulse 74  Ht 5\' 5"  (1.651 m)  Wt 136 lb (61.689 kg)  BMI 22.63 kg/m2  Physical Exam Patient is in NAD HEENT:  Normocephalic,  atraumatic. EOMI, PERRLA.  Neck: JVP is normal.   Lungs: clear to auscultation. No rales no wheezes.  Heart: Regular rate and rhythm. Normal S1, S2. No S3.   No significant murmurs. PMI not displaced.  Abdomen:  Supple, nontender. Normal bowel sounds. No masses. No hepatomegaly.  Extremities:   Good distal pulses throughout. No lower extremity edema.  Musculoskeletal :moving all extremities.  Neuro:   alert and oriented x3.  CN II-XII grossly intact.  EKG  SR 71 bpm  Occasional PVC    Assessment and Plan:  1.  Afib  I would recomm that the patient begin coumadin.  He should stop Plavix.  He will follow up initially here with INR check and then transition to [place] office.  He lives in [place] Overall his palpitations are infrequent.  He remains active  2.  CVA  Prabably due to 1.  Undergoing rehab  Still with some problems swallowing  3.  HTN  High now.  Will need to be followed.  4.  HL  Continue statin.

## 2012-12-23 ENCOUNTER — Telehealth: Payer: Self-pay | Admitting: Internal Medicine

## 2012-12-23 NOTE — Telephone Encounter (Signed)
F/U   Please vertify if patient has a  coumadin allergies this listed on [facility] chart.

## 2012-12-23 NOTE — Telephone Encounter (Signed)
I spoke with Irving Burton the Speech Pathologist for [facility] and she questioned if the pt had an allergy to coumadin.  Coumadin shows up as an allergy in there system. Irving Burton did contact the pt's PCP and they do not have any information that the pt is allergic to coumadin.  I reviewed the pt's hospital records and the pt was not given coumadin during hospitalization. The pt was given tPa and this may be what the pt describes as a medicine to reverse stroke.  Irving Burton also checked the pt's BP and it was 140/80.  The pt's BP cuff shows 184/95.  The pt's BP cuff is due for a battery change.  The pt has a HHN who will be visiting today Lucita Ferrara).  I spoke with Pam who is in the pt's home and made her aware that the pt does not have an allergy to coumadin. Pam checked the pt's BP and it was 130/68.  Pam will compare this reading with the pt's home BP cuff when available to check accuracy. The pt also had to reschedule coumadin visit to 12/28/12 due to a CT scan that is already scheduled on 12/25/12. The pt will continue with current plan.

## 2012-12-23 NOTE — Telephone Encounter (Signed)
New Problem:   Called in because the patient is allergic to coumadin, has been placed on coumadin back in 10/2012 when he was admitted to [facility] for a stroke, and his BP is running much higher than they would prefer (180/90's).  Please call back.

## 2012-12-28 ENCOUNTER — Ambulatory Visit (INDEPENDENT_AMBULATORY_CARE_PROVIDER_SITE_OTHER): Payer: Medicare Other | Admitting: *Deleted

## 2012-12-28 DIAGNOSIS — Z7189 Other specified counseling: Secondary | ICD-10-CM | POA: Insufficient documentation

## 2012-12-28 DIAGNOSIS — I639 Cerebral infarction, unspecified: Secondary | ICD-10-CM

## 2012-12-28 DIAGNOSIS — I6350 Cerebral infarction due to unspecified occlusion or stenosis of unspecified cerebral artery: Secondary | ICD-10-CM

## 2012-12-28 DIAGNOSIS — Z7901 Long term (current) use of anticoagulants: Secondary | ICD-10-CM

## 2012-12-28 DIAGNOSIS — I4891 Unspecified atrial fibrillation: Secondary | ICD-10-CM

## 2012-12-28 NOTE — Patient Instructions (Signed)

## 2013-01-04 ENCOUNTER — Ambulatory Visit: Payer: Self-pay | Admitting: Internal Medicine

## 2013-01-04 DIAGNOSIS — I4891 Unspecified atrial fibrillation: Secondary | ICD-10-CM

## 2013-01-04 DIAGNOSIS — I639 Cerebral infarction, unspecified: Secondary | ICD-10-CM

## 2013-01-04 DIAGNOSIS — Z7901 Long term (current) use of anticoagulants: Secondary | ICD-10-CM

## 2013-01-04 LAB — POCT INR: INR: 2.4

## 2013-01-11 ENCOUNTER — Ambulatory Visit: Payer: Self-pay | Admitting: Internal Medicine

## 2013-01-11 DIAGNOSIS — Z7901 Long term (current) use of anticoagulants: Secondary | ICD-10-CM

## 2013-01-11 DIAGNOSIS — I4891 Unspecified atrial fibrillation: Secondary | ICD-10-CM

## 2013-01-11 DIAGNOSIS — I639 Cerebral infarction, unspecified: Secondary | ICD-10-CM

## 2013-01-11 LAB — POCT INR: INR: 1.7

## 2013-01-18 ENCOUNTER — Ambulatory Visit: Payer: Self-pay | Admitting: Cardiology

## 2013-01-18 DIAGNOSIS — I639 Cerebral infarction, unspecified: Secondary | ICD-10-CM

## 2013-01-18 DIAGNOSIS — Z7901 Long term (current) use of anticoagulants: Secondary | ICD-10-CM

## 2013-01-18 DIAGNOSIS — I4891 Unspecified atrial fibrillation: Secondary | ICD-10-CM

## 2013-01-20 ENCOUNTER — Other Ambulatory Visit: Payer: Self-pay | Admitting: *Deleted

## 2013-01-20 MED ORDER — SIMVASTATIN 10 MG PO TABS: 10.0000 mg | ORAL_TABLET | Freq: Every day | ORAL | Status: DC

## 2013-01-25 ENCOUNTER — Ambulatory Visit: Payer: Self-pay | Admitting: Pharmacist

## 2013-01-25 DIAGNOSIS — I4891 Unspecified atrial fibrillation: Secondary | ICD-10-CM

## 2013-01-25 DIAGNOSIS — Z7901 Long term (current) use of anticoagulants: Secondary | ICD-10-CM

## 2013-01-25 DIAGNOSIS — I639 Cerebral infarction, unspecified: Secondary | ICD-10-CM

## 2013-01-26 ENCOUNTER — Ambulatory Visit: Payer: Self-pay | Admitting: Internal Medicine

## 2013-02-03 ENCOUNTER — Ambulatory Visit (INDEPENDENT_AMBULATORY_CARE_PROVIDER_SITE_OTHER): Payer: Medicare Other

## 2013-02-03 DIAGNOSIS — I4891 Unspecified atrial fibrillation: Secondary | ICD-10-CM

## 2013-02-03 DIAGNOSIS — I639 Cerebral infarction, unspecified: Secondary | ICD-10-CM

## 2013-02-03 DIAGNOSIS — Z7901 Long term (current) use of anticoagulants: Secondary | ICD-10-CM

## 2013-02-03 DIAGNOSIS — I6350 Cerebral infarction due to unspecified occlusion or stenosis of unspecified cerebral artery: Secondary | ICD-10-CM

## 2013-02-11 ENCOUNTER — Ambulatory Visit: Payer: Self-pay | Admitting: Specialist

## 2013-02-17 ENCOUNTER — Ambulatory Visit (INDEPENDENT_AMBULATORY_CARE_PROVIDER_SITE_OTHER): Payer: Medicare Other

## 2013-02-17 DIAGNOSIS — I639 Cerebral infarction, unspecified: Secondary | ICD-10-CM

## 2013-02-17 DIAGNOSIS — I4891 Unspecified atrial fibrillation: Secondary | ICD-10-CM

## 2013-02-17 DIAGNOSIS — Z7901 Long term (current) use of anticoagulants: Secondary | ICD-10-CM

## 2013-02-17 DIAGNOSIS — I6350 Cerebral infarction due to unspecified occlusion or stenosis of unspecified cerebral artery: Secondary | ICD-10-CM

## 2013-02-17 LAB — POCT INR: INR: 2.9

## 2013-03-02 ENCOUNTER — Telehealth: Payer: Self-pay | Admitting: Internal Medicine

## 2013-03-02 NOTE — Telephone Encounter (Signed)
New Problem   Mandy want to talk to you about INR machine for pt to use at home for home testing. She want to know if she need to do retraining with pt to make get pt with compliance with home testing. Please call Angelica Chessman w/[employer] about this matter

## 2013-03-03 ENCOUNTER — Ambulatory Visit (INDEPENDENT_AMBULATORY_CARE_PROVIDER_SITE_OTHER): Payer: Medicare Other

## 2013-03-03 DIAGNOSIS — I4891 Unspecified atrial fibrillation: Secondary | ICD-10-CM

## 2013-03-03 DIAGNOSIS — Z7901 Long term (current) use of anticoagulants: Secondary | ICD-10-CM

## 2013-03-03 DIAGNOSIS — I6350 Cerebral infarction due to unspecified occlusion or stenosis of unspecified cerebral artery: Secondary | ICD-10-CM

## 2013-03-03 DIAGNOSIS — I639 Cerebral infarction, unspecified: Secondary | ICD-10-CM

## 2013-03-03 LAB — POCT INR: INR: 2.6

## 2013-03-04 NOTE — Telephone Encounter (Signed)
Called spoke with [facility], pt is able to come into office for INR checks without difficulty.  Pt does not want to home test weekly, he only checks in office every 4 weeks. Advised Mandy to pick up self testing machine.

## 2013-03-31 ENCOUNTER — Ambulatory Visit (INDEPENDENT_AMBULATORY_CARE_PROVIDER_SITE_OTHER): Payer: Medicare Other

## 2013-03-31 DIAGNOSIS — I6350 Cerebral infarction due to unspecified occlusion or stenosis of unspecified cerebral artery: Secondary | ICD-10-CM

## 2013-03-31 DIAGNOSIS — Z7901 Long term (current) use of anticoagulants: Secondary | ICD-10-CM

## 2013-03-31 DIAGNOSIS — I639 Cerebral infarction, unspecified: Secondary | ICD-10-CM

## 2013-03-31 DIAGNOSIS — I4891 Unspecified atrial fibrillation: Secondary | ICD-10-CM

## 2013-03-31 LAB — POCT INR: INR: 1.9

## 2013-04-28 ENCOUNTER — Telehealth: Payer: Self-pay

## 2013-04-28 ENCOUNTER — Ambulatory Visit (INDEPENDENT_AMBULATORY_CARE_PROVIDER_SITE_OTHER): Payer: Medicare Other

## 2013-04-28 DIAGNOSIS — Z7901 Long term (current) use of anticoagulants: Secondary | ICD-10-CM

## 2013-04-28 DIAGNOSIS — I6350 Cerebral infarction due to unspecified occlusion or stenosis of unspecified cerebral artery: Secondary | ICD-10-CM

## 2013-04-28 DIAGNOSIS — I4891 Unspecified atrial fibrillation: Secondary | ICD-10-CM

## 2013-04-28 DIAGNOSIS — I639 Cerebral infarction, unspecified: Secondary | ICD-10-CM

## 2013-04-28 LAB — POCT INR: INR: 2.9

## 2013-04-28 MED ORDER — WARFARIN SODIUM 5 MG PO TABS: ORAL_TABLET | ORAL | Status: DC

## 2013-04-28 NOTE — Telephone Encounter (Signed)
Needs directions for pt warfarin. Please call

## 2013-04-28 NOTE — Telephone Encounter (Signed)
Returned call to [place] at [employer].  Advised sig on Warfarin needs to say Take as directed by anticoagulation clinic as previously specified.  Verified #40 tablets is a 30 day supply.

## 2013-04-28 NOTE — Telephone Encounter (Signed)
See below

## 2013-05-26 ENCOUNTER — Ambulatory Visit (INDEPENDENT_AMBULATORY_CARE_PROVIDER_SITE_OTHER): Payer: Medicare Other

## 2013-05-26 DIAGNOSIS — I6350 Cerebral infarction due to unspecified occlusion or stenosis of unspecified cerebral artery: Secondary | ICD-10-CM

## 2013-05-26 DIAGNOSIS — I4891 Unspecified atrial fibrillation: Secondary | ICD-10-CM

## 2013-05-26 DIAGNOSIS — Z7901 Long term (current) use of anticoagulants: Secondary | ICD-10-CM

## 2013-05-26 DIAGNOSIS — I639 Cerebral infarction, unspecified: Secondary | ICD-10-CM

## 2013-05-26 LAB — POCT INR: INR: 2.8

## 2013-07-07 ENCOUNTER — Ambulatory Visit (INDEPENDENT_AMBULATORY_CARE_PROVIDER_SITE_OTHER): Payer: Medicare Other | Admitting: *Deleted

## 2013-07-07 DIAGNOSIS — I4891 Unspecified atrial fibrillation: Secondary | ICD-10-CM

## 2013-07-07 DIAGNOSIS — I639 Cerebral infarction, unspecified: Secondary | ICD-10-CM

## 2013-07-07 DIAGNOSIS — I6350 Cerebral infarction due to unspecified occlusion or stenosis of unspecified cerebral artery: Secondary | ICD-10-CM

## 2013-07-07 DIAGNOSIS — Z7901 Long term (current) use of anticoagulants: Secondary | ICD-10-CM

## 2013-07-08 ENCOUNTER — Encounter: Payer: Self-pay | Admitting: Neurology

## 2013-07-13 ENCOUNTER — Ambulatory Visit: Payer: Self-pay | Admitting: Neurology

## 2013-08-11 ENCOUNTER — Ambulatory Visit (INDEPENDENT_AMBULATORY_CARE_PROVIDER_SITE_OTHER): Payer: Medicare Other

## 2013-08-11 DIAGNOSIS — I4891 Unspecified atrial fibrillation: Secondary | ICD-10-CM

## 2013-08-11 DIAGNOSIS — Z7901 Long term (current) use of anticoagulants: Secondary | ICD-10-CM

## 2013-08-11 DIAGNOSIS — I639 Cerebral infarction, unspecified: Secondary | ICD-10-CM

## 2013-08-11 DIAGNOSIS — I6350 Cerebral infarction due to unspecified occlusion or stenosis of unspecified cerebral artery: Secondary | ICD-10-CM

## 2013-09-15 ENCOUNTER — Ambulatory Visit (INDEPENDENT_AMBULATORY_CARE_PROVIDER_SITE_OTHER): Payer: Medicare Other | Admitting: General Practice

## 2013-09-15 DIAGNOSIS — I6350 Cerebral infarction due to unspecified occlusion or stenosis of unspecified cerebral artery: Secondary | ICD-10-CM

## 2013-09-15 DIAGNOSIS — I639 Cerebral infarction, unspecified: Secondary | ICD-10-CM

## 2013-09-15 DIAGNOSIS — Z7901 Long term (current) use of anticoagulants: Secondary | ICD-10-CM

## 2013-09-15 DIAGNOSIS — I4891 Unspecified atrial fibrillation: Secondary | ICD-10-CM

## 2013-09-16 ENCOUNTER — Other Ambulatory Visit: Payer: Self-pay | Admitting: *Deleted

## 2013-09-16 MED ORDER — WARFARIN SODIUM 5 MG PO TABS: ORAL_TABLET | ORAL | Status: DC

## 2013-09-16 NOTE — Telephone Encounter (Signed)
Please review and refill, Thank You. 

## 2013-10-27 ENCOUNTER — Ambulatory Visit (INDEPENDENT_AMBULATORY_CARE_PROVIDER_SITE_OTHER): Payer: Medicare Other | Admitting: General Practice

## 2013-10-27 DIAGNOSIS — I4891 Unspecified atrial fibrillation: Secondary | ICD-10-CM

## 2013-10-27 DIAGNOSIS — Z7901 Long term (current) use of anticoagulants: Secondary | ICD-10-CM

## 2013-10-27 DIAGNOSIS — I639 Cerebral infarction, unspecified: Secondary | ICD-10-CM

## 2013-10-27 DIAGNOSIS — I6350 Cerebral infarction due to unspecified occlusion or stenosis of unspecified cerebral artery: Secondary | ICD-10-CM

## 2013-10-27 LAB — POCT INR: INR: 3

## 2013-10-27 MED ORDER — WARFARIN SODIUM 5 MG PO TABS: ORAL_TABLET | ORAL | Status: DC

## 2013-11-15 ENCOUNTER — Emergency Department (HOSPITAL_COMMUNITY)
Admission: EM | Admit: 2013-11-15 | Discharge: 2013-11-15 | Disposition: A | Discharge status: Home / Self Care | Payer: Medicare Other | Attending: Emergency Medicine | Admitting: Emergency Medicine

## 2013-11-15 ENCOUNTER — Emergency Department (HOSPITAL_COMMUNITY): Payer: Medicare Other

## 2013-11-15 ENCOUNTER — Encounter (HOSPITAL_COMMUNITY): Payer: Self-pay | Admitting: Emergency Medicine

## 2013-11-15 DIAGNOSIS — R296 Repeated falls: Secondary | ICD-10-CM | POA: Insufficient documentation

## 2013-11-15 DIAGNOSIS — Z7901 Long term (current) use of anticoagulants: Secondary | ICD-10-CM | POA: Insufficient documentation

## 2013-11-15 DIAGNOSIS — I4891 Unspecified atrial fibrillation: Secondary | ICD-10-CM | POA: Insufficient documentation

## 2013-11-15 DIAGNOSIS — Y9289 Other specified places as the place of occurrence of the external cause: Secondary | ICD-10-CM | POA: Insufficient documentation

## 2013-11-15 DIAGNOSIS — S7001XA Contusion of right hip, initial encounter: Secondary | ICD-10-CM

## 2013-11-15 DIAGNOSIS — Z79899 Other long term (current) drug therapy: Secondary | ICD-10-CM | POA: Insufficient documentation

## 2013-11-15 DIAGNOSIS — Z8673 Personal history of transient ischemic attack (TIA), and cerebral infarction without residual deficits: Secondary | ICD-10-CM | POA: Insufficient documentation

## 2013-11-15 DIAGNOSIS — Z9889 Other specified postprocedural states: Secondary | ICD-10-CM | POA: Insufficient documentation

## 2013-11-15 DIAGNOSIS — Z87891 Personal history of nicotine dependence: Secondary | ICD-10-CM | POA: Insufficient documentation

## 2013-11-15 DIAGNOSIS — Y9389 Activity, other specified: Secondary | ICD-10-CM | POA: Insufficient documentation

## 2013-11-15 DIAGNOSIS — K219 Gastro-esophageal reflux disease without esophagitis: Secondary | ICD-10-CM | POA: Insufficient documentation

## 2013-11-15 DIAGNOSIS — E785 Hyperlipidemia, unspecified: Secondary | ICD-10-CM | POA: Insufficient documentation

## 2013-11-15 DIAGNOSIS — I10 Essential (primary) hypertension: Secondary | ICD-10-CM | POA: Insufficient documentation

## 2013-11-15 DIAGNOSIS — M79609 Pain in unspecified limb: Secondary | ICD-10-CM

## 2013-11-15 DIAGNOSIS — S7000XA Contusion of unspecified hip, initial encounter: Secondary | ICD-10-CM | POA: Insufficient documentation

## 2013-11-15 LAB — PROTIME-INR
INR: 2.24 — ABNORMAL HIGH (ref 0.00–1.49)
Prothrombin Time: 24.1 seconds — ABNORMAL HIGH (ref 11.6–15.2)

## 2013-11-15 MED ORDER — DEXAMETHASONE 4 MG PO TABS
8.0000 mg | ORAL_TABLET | Freq: Once | ORAL | Status: AC
  Administered 2013-11-15: 8 mg via ORAL
  Filled 2013-11-15: qty 2

## 2013-11-15 MED ORDER — HYDROMORPHONE HCL PF 1 MG/ML IJ SOLN: 0.5000 mg | Freq: Once | INTRAMUSCULAR | Status: DC

## 2013-11-15 MED ORDER — DIAZEPAM 5 MG PO TABS
2.5000 mg | ORAL_TABLET | Freq: Once | ORAL | Status: AC
  Administered 2013-11-15: 2.5 mg via ORAL

## 2013-11-15 MED ORDER — HYDROCODONE-ACETAMINOPHEN 5-325 MG PO TABS: 1.0000 | ORAL_TABLET | Freq: Four times a day (QID) | ORAL | Status: DC | PRN

## 2013-11-15 MED ORDER — MORPHINE SULFATE 2 MG/ML IJ SOLN
INTRAMUSCULAR | Status: AC
  Filled 2013-11-15: qty 1

## 2013-11-15 MED ORDER — MORPHINE SULFATE 4 MG/ML IJ SOLN
INTRAMUSCULAR | Status: AC
  Administered 2013-11-15: 6 mg via INTRAMUSCULAR
  Filled 2013-11-15: qty 1

## 2013-11-15 MED ORDER — DIAZEPAM 5 MG PO TABS
ORAL_TABLET | ORAL | Status: AC
  Administered 2013-11-15: 2.5 mg via ORAL
  Filled 2013-11-15: qty 1

## 2013-11-15 MED ORDER — MORPHINE SULFATE 4 MG/ML IJ SOLN
6.0000 mg | Freq: Once | INTRAMUSCULAR | Status: AC
  Administered 2013-11-15: 6 mg via INTRAMUSCULAR

## 2013-11-15 NOTE — ED Notes (Signed)
Contacted Doppler, unsure how long before receive pt for exam. Will keep pt updated.

## 2013-11-15 NOTE — ED Notes (Signed)
Pt ambulated with assistance with walker out into the hall with tech and MD, pt states that his legs feel very weak but is still able to ambulate with assistance.

## 2013-11-15 NOTE — ED Notes (Signed)
Pt reports fall over a week ago, the past few days has had increased pain to right leg, unable to ambulate as usual. Was not seen post fall, no LOC with fall. Alert and oriented x 4. No obvious deformity. Pain only with movement.

## 2013-11-15 NOTE — ED Provider Notes (Signed)
I saw and evaluated the patient, reviewed the resident's note and I agree with the findings and plan.  EKG Interpretation   None       89yM with RLE pain. Onset a few days ago. This was preceded by a fall a week prior, but suspect noncontributory given such delayed onset of current symptoms. Pt having hard time exactly localizing. He says R knee but pointing to anterior distal thigh but has tenderness along buttock and posterior proximal thigh. RLE symmetric as compared to L. XR mentioning small effusion, but not appreciated on my exam. No skin changes.  Passive ROM of both hip and knee seems fairly tolerable. Pain seems to significantly worse with active R hip flexion.  Feet warm with palpable DP pulse.   Pt with different descriptions of symptoms on repeat exams. My suspicion is that this is lumbar radiculopathy. Little/no pain at rest. Worse with movement. Pain radiating down into ankle. Tenderness posteriorly. Pt reports recent XR of back and hip w/o acute abnormality. [place] with baker's cyst but not consistent with other symptoms. Pt still cannot walk 2/2 pain. Will MR.   Raeford Razor, MD 11/15/13 [phone]

## 2013-11-15 NOTE — ED Notes (Signed)
Patient transported to MRI 

## 2013-11-15 NOTE — ED Notes (Signed)
Pt fell approximately 1.5 week ago. The past few days has had increase in right leg pain. Unable to ambulate, severe pain with movement.

## 2013-11-15 NOTE — ED Provider Notes (Signed)
CSN: [id number]     Arrival date & time 11/15/13  1049 History   None    Chief Complaint  Patient presents with  . Leg Pain   (Consider location/radiation/quality/duration/timing/severity/associated sxs/prior Treatment) HPI [name] is a 79 y.o. male w/ PMHx of HTN, HLD, PAF on Coumadin, CVA in 10/2012, dysphagia, and GERD, presents to the ED w/ complaints of right knee pain for 5 days or so. The patient claims the pain is located in his knee joint, making it very difficult to bend and ambulate. He claims he felt it started to "tighten up" on Wednesday night, but denies any swelling, redness, or change in temperature. The past couple days have been so bad he has not been able to move from his chair. He was seen by his PCP last Friday, 11/12/13 for this issue and was given Hydrocodone for the pain and sent home. The patient denies any other issues at this time. No SOB, chest pain, LE swelling or edema, fever, chills, nausea, vomiting, diarrhea, or dysuria. No recent surgeries, no prolonged immobilization, no previous DVT or PE. Patient does mention he had a fall about 1-2 weeks ago but does not think the fall is at all associated w/ his knee pain as he did not twist or injure it at the time. He does claim to have infrequent falls 2/2 balance issues.   Past Medical History  Diagnosis Date  . GERD (gastroesophageal reflux disease)   . Hypertension   . Dysphagia   . Hematuria   . Hyperlipidemia    Past Surgical History  Procedure Laterality Date  . Hip surgery  2002  . Shoulder surgery  2005  . Tee without cardioversion  10/29/2012    Procedure: TRANSESOPHAGEAL ECHOCARDIOGRAM (TEE);  Surgeon: Pricilla Riffle, MD;  Location: [facility] ENDOSCOPY;  Service: Cardiovascular;  Laterality: N/A;   No family history on file. History  Substance Use Topics  . Smoking status: Former Smoker    Quit date: 12/21/1933  . Smokeless tobacco: Not on file  . Alcohol Use: No    Review of Systems General:  Denies fever, chills, diaphoresis, appetite change and fatigue.  Respiratory: Denies SOB, DOE, cough, chest tightness, and wheezing.   Cardiovascular: Denies chest pain, palpitations and leg swelling.  Gastrointestinal: Denies nausea, vomiting, abdominal pain, diarrhea, constipation, blood in stool and abdominal distention.  Genitourinary: Denies dysuria, urgency, frequency, hematuria, flank pain and difficulty urinating.  Endocrine: Denies hot or cold intolerance, sweats, polyuria, polydipsia. Musculoskeletal: Positive for right knee pain resulting in gait difficulty. Denies myalgias, back pain, joint swelling. Skin: Denies pallor, rash and wounds.  Neurological: Denies dizziness, seizures, syncope, weakness, lightheadedness, numbness and headaches.  Psychiatric/Behavioral: Denies mood changes, confusion, nervousness, sleep disturbance and agitation.  Allergies  Review of patient's allergies indicates no known allergies.  Home Medications   Current Outpatient Rx  Name  Route  Sig  Dispense  Refill  . omeprazole (PRILOSEC) 20 MG capsule   Oral   Take 20 mg by mouth daily.         . simvastatin (ZOCOR) 10 MG tablet   Oral   Take 1 tablet (10 mg total) by mouth daily.   30 tablet   6   . warfarin (COUMADIN) 5 MG tablet      Take as directed by anticoagulation clinic   40 tablet   3     30 day supply    Physical Exam Filed Vitals:   11/15/13 1051  BP: 140/83  Pulse: 85  Temp: 98 F (36.7 C)  TempSrc: Oral  Resp: 16  SpO2: 98%   General: Vital signs reviewed.  Patient is a well-developed and well-nourished, in no acute distress and cooperative with exam. Alert and oriented x3.  Head: Normocephalic and atraumatic. Eyes: PERRL, EOMI, conjunctivae normal, No scleral icterus.  Neck: Supple, trachea midline, normal ROM, No JVD, masses, thyromegaly, or carotid bruit present.  Cardiovascular: RRR, S1 normal, S2 normal, no murmurs, gallops, or rubs. Pulmonary/Chest: Normal  respiratory effort, CTAB, no wheezes, rales, or rhonchi. Abdominal: Soft, non-tender, non-distended, bowel sounds are normal, no masses, organomegaly, or guarding present.  Musculoskeletal: Right knee pain and stiffness. Decreased ROM d/t pain. Left knee also w/ decreased ROM d/t pain. No significant swelling, erythema, or temperature. Extremities: No swelling or edema,  pulses symmetric and intact bilaterally. No cyanosis or clubbing. Neurological: A&O x3, Strength is normal and symmetric bilaterally, cranial nerve II-XII are grossly intact, no focal motor deficit, sensory intact to light touch bilaterally.  Skin: Warm, dry and intact. No rashes or erythema. Psychiatric: Normal mood and affect. speech and behavior is normal. Cognition and memory are normal.   ED Course  Procedures (including critical care time) Labs Review Labs Reviewed - No data to display Imaging Review No results found.  EKG Interpretation   None       MDM    [name] is a 79 y.o. male w/ PMHx of HTN, HLD, PAF on Coumadin, CVA in 10/2012, dysphagia, and GERD, presents to the ED w/ complaints of right knee pain for 5 days or so. Patient poor historian with regards to the location of his pain, but describes it as originating in the knee and shooting into the leg above and below the knee joint. Recently seen by PA at [facility] office. Patient had spinal/hip imaging showing only arthritic changes. Patient w/out swelling, erythema, or increased warmth over the knee joint. Patient does not appear septic, otherwise in good health. Possible sciatic pain, lumbar spine nerve compression, or worsening knee OA at this time.  -XR right knee shows no fracture, but with small joint effusion, extensive chondrocalcinosis, and remote healed proximal fibular fracture. No obvious source of the patient's pain at this time. -Given 6 mg Morphine for pain + 2.5 mg Valium -LE doppler w/out DVT, however Baker's cyst is seen. Still not likely  the cause of his knee pain -INR therapeutic to 2.24.  Patient on Coumadin  -Give Decadron 8 mg po + Dilaudid 0.5 mg IM  -Patient will need MRI of lumbar spine to determine other possible causes of significant LE pain inhibiting his ability to walk. Will sign out to night team/ Dr. Robley Fries regarding further workup.  Courtney Paris, MD 11/15/13 [phone]

## 2013-11-16 NOTE — ED Provider Notes (Signed)
I saw and evaluated the patient, reviewed the resident's note and I agree with the findings and plan.  EKG Interpretation   None       See other note.   Raeford Razor, MD 11/16/13 [phone]

## 2013-11-19 NOTE — Care Management Note (Signed)
    Page 1 of 2   11/16/2013     10:53:34 AM   CARE MANAGEMENT NOTE 11/16/2013  Patient:  Norman Dickerson, Norman Dickerson   Account Number:  [id number]  Date Initiated:  11/15/2013  Documentation initiated by:  Roseanne Reno  Subjective/Objective Assessment:   Status Post a fall 1 wk ago.  Now w/ leg pain.  Suspect Iliac Hematoma.     Action/Plan:   RW, BSC, RN, PT to eval and treat.   Anticipated DC Date:  11/15/2013   Anticipated DC Plan:  HOME W HOME HEALTH SERVICES      DC Planning Services  CM consult      [facility] Choice  HOME HEALTH  DURABLE MEDICAL EQUIPMENT   Choice offered to / List presented to:  C-3 Spouse        HH arranged  HH-1 RN  HH-2 PT      HH agency  [facility]   Status of service:  Completed, signed off  Discharge Disposition:  HOME W HOME HEALTH SERVICES  Comments:  11/16/13  1010a - CM called and spoke w/ pt's wife, Norman Dickerson - discussed HHC and DME needs, they have a BSC and RW so they will not need those.  The only thing they will need is the RN and PT.  We discussed HHC agencies, choice offered and Norman Dickerson had no preference.  Referral made to [facility] at [facility].  Expalined to Norman Dickerson that [facility] would be in contact w/ them to arrange for a time for these visits.  Norman Dickerson voiced understanding.  No further questions at this time. Encouraged her to call the pt's PCP if any questions or concerns or to let [facility] know.  Norman Dickerson  [phone]    11/16/13  1045a - Late Entry - On call:  840p - Received call from [facility], spoke w/ Norman Dickerson who stated that the pt would need DME and HHC status post a fall 1 wk ago and still w/ leg pain and unable to ambulate.  Spoke w/ the pt's wife Norman Dickerson, verified address and phone number as correct on face sheet. Discussed HHC and I will verify what agencies will cover their insurance and call her in the am - Ms. Bills in agreement.  Notified Armed forces technical officer of dc plan.  TJohnson, RNBSN

## 2013-12-08 ENCOUNTER — Ambulatory Visit (INDEPENDENT_AMBULATORY_CARE_PROVIDER_SITE_OTHER): Payer: Medicare Other

## 2013-12-08 DIAGNOSIS — I6350 Cerebral infarction due to unspecified occlusion or stenosis of unspecified cerebral artery: Secondary | ICD-10-CM

## 2013-12-08 DIAGNOSIS — I639 Cerebral infarction, unspecified: Secondary | ICD-10-CM

## 2013-12-08 DIAGNOSIS — Z7901 Long term (current) use of anticoagulants: Secondary | ICD-10-CM

## 2013-12-08 DIAGNOSIS — I4891 Unspecified atrial fibrillation: Secondary | ICD-10-CM

## 2014-01-17 IMAGING — CT CT HEAD W/O CM
2 series · 15 of 30 positions shown, 19 images · non-contrast
Comparison: None.

CLINICAL DATA: Code stroke, right facial droop, difficulty speaking

CT HEAD WITHOUT CONTRAST
TECHNIQUE: Contiguous axial images were obtained from the base of
the skull through the vertex without contrast.

[Series 2: head w/o · axial · non-contrast · 0.52mm/px · z∈[+1073,+1213]mm · 13 of 34 slices shown, 17 images]
[im 3/34  brain]
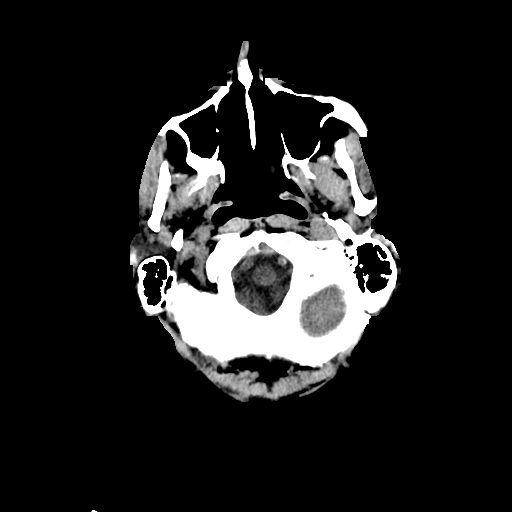
[im 3/34  bone]
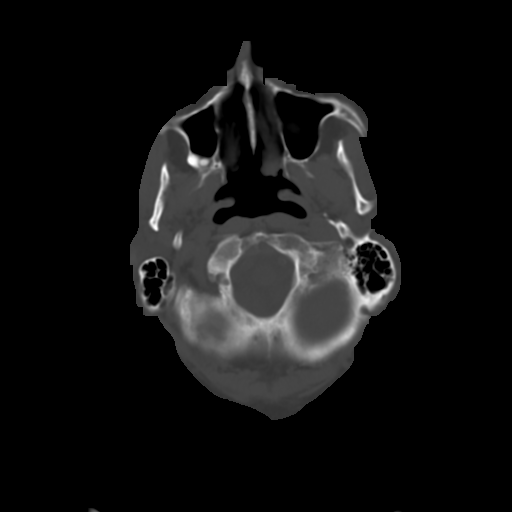
[im 5/34  brain]
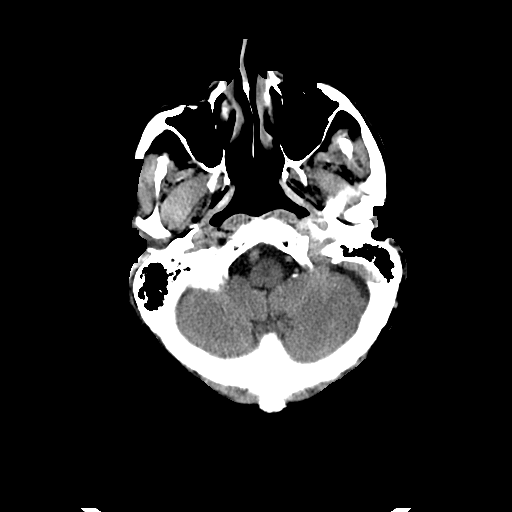
[im 8/34  brain]
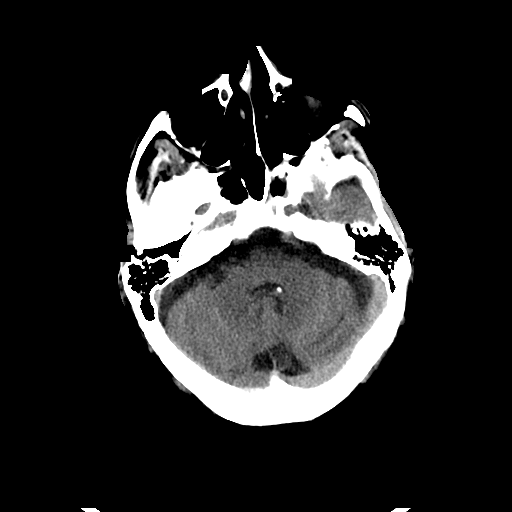
[im 10/34  brain]
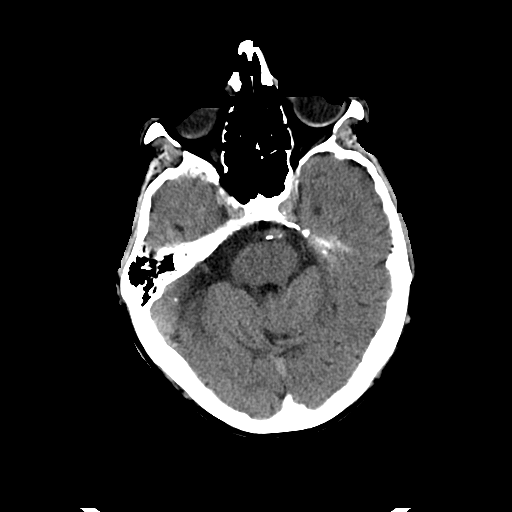
[im 12/34  brain]
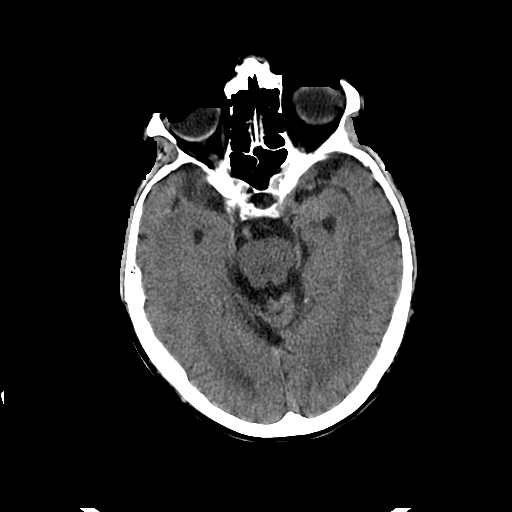
[im 12/34  bone]
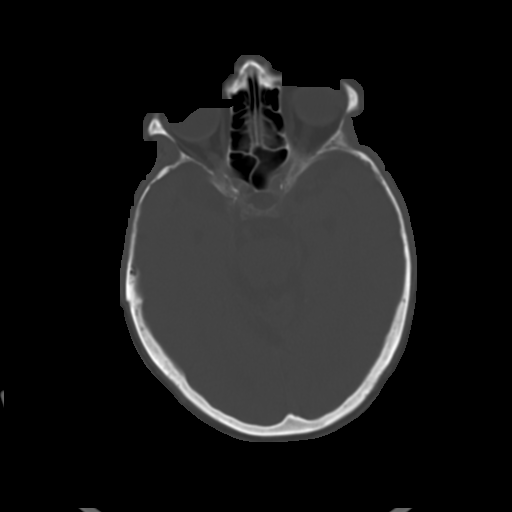
[im 15/34  brain]
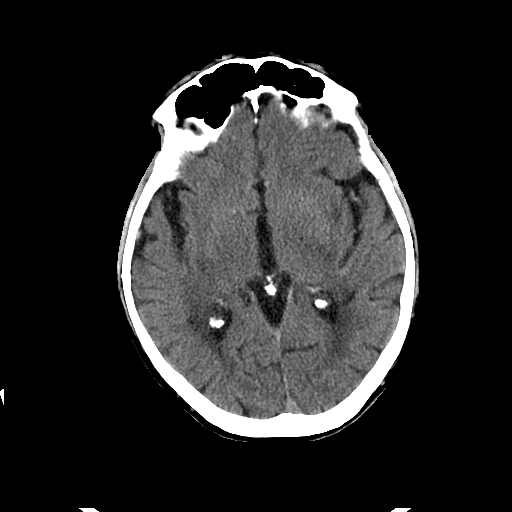
[im 17/34  brain]
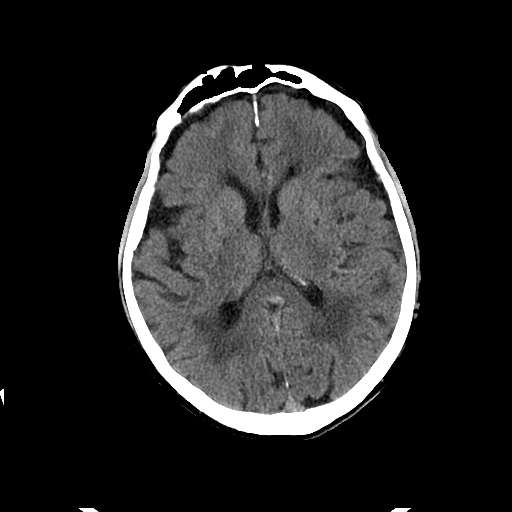
[im 19/34  brain]
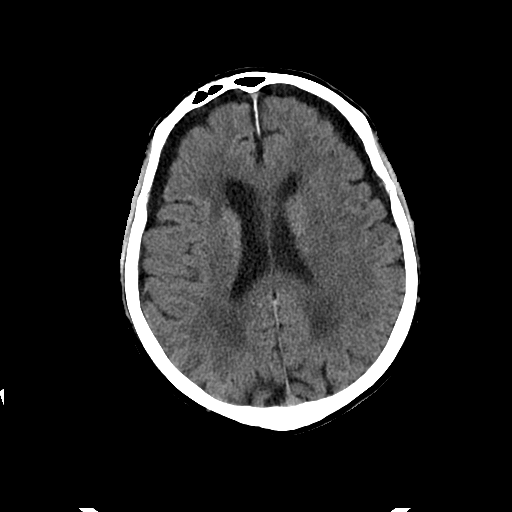
[im 22/34  brain]
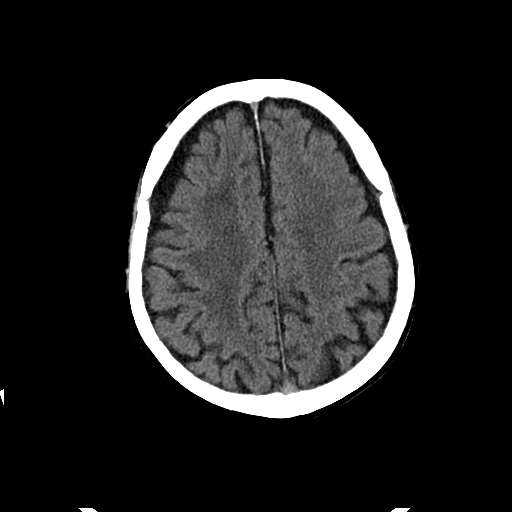
[im 22/34  bone]
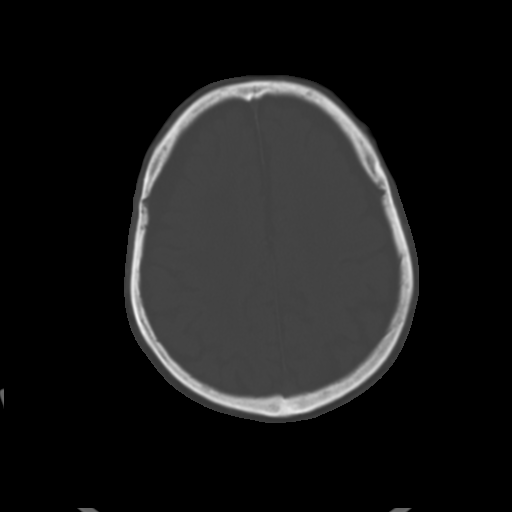
[im 24/34  brain]
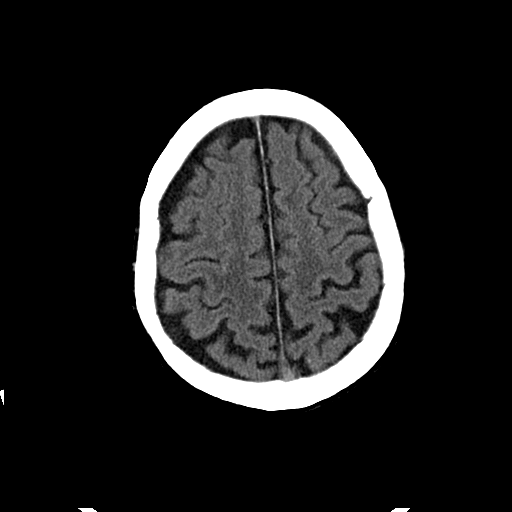
[im 26/34  brain]
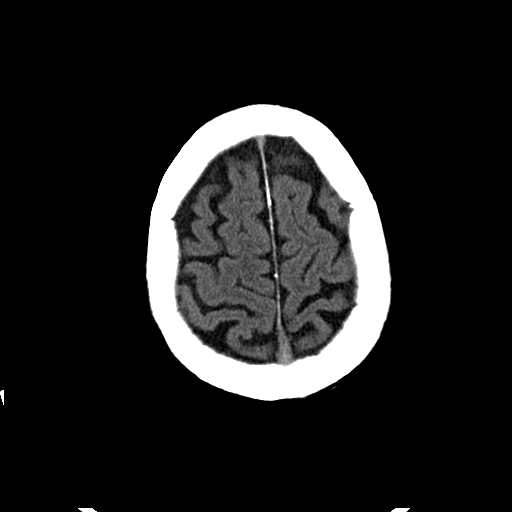
[im 29/34  brain]
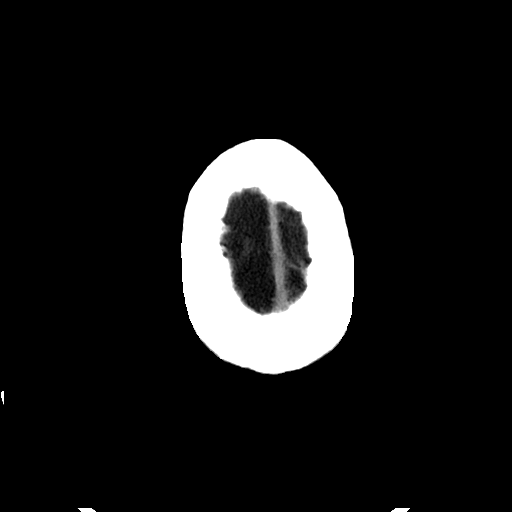
[im 31/34  brain]
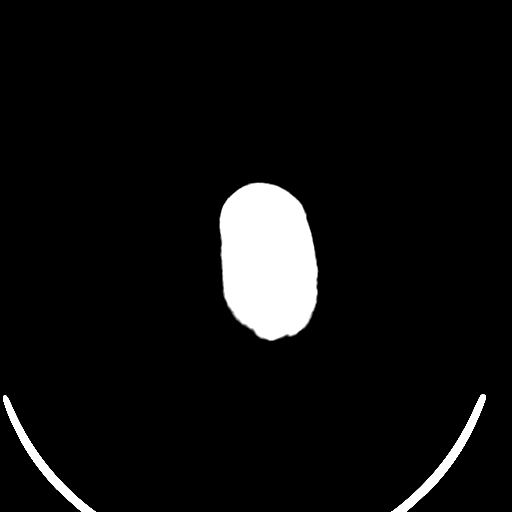
[im 31/34  bone]
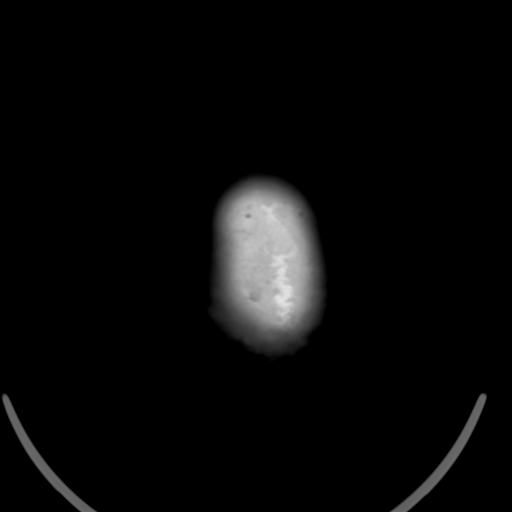

[Series 3: head w/o bone · axial · non-contrast · 0.52mm/px · z∈[+1073,+1098]mm · 2 of 34 slices shown]
[im 3/34  bone]
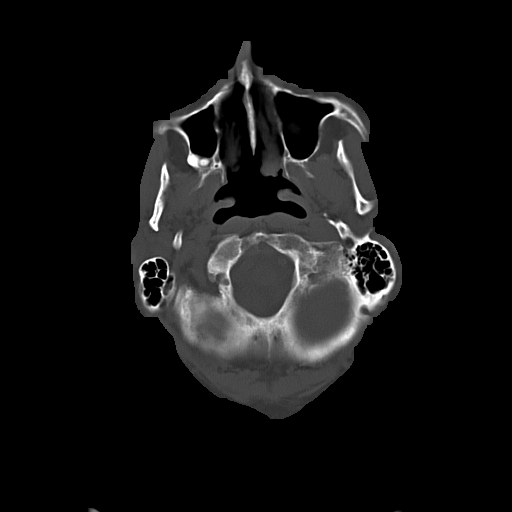
[im 8/34  bone]
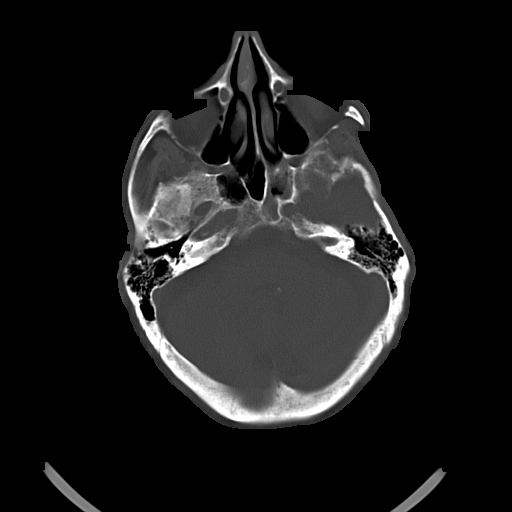

[15 of 30 positions shown; findings below may reference images not displayed]

FINDINGS: No evidence of parenchymal hemorrhage or extra-axial
fluid collection. No mass lesion, mass effect, or midline shift.

No CT evidence of acute infarction.

Extensive subcortical white matter and periventricular small vessel
ischemic changes, most prominent in the subcortical right frontal
lobe.  Intracranial atherosclerosis.

Global cortical atrophy.  No ventriculomegaly.

The visualized paranasal sinuses are essentially clear. The mastoid
air cells are unopacified.

No evidence of calvarial fracture.
IMPRESSION: No evidence of acute intracranial abnormality.

Atrophy with small vessel ischemic changes and intracranial
atherosclerosis.

These results were called by telephone on 10/21/2012 at 0267 hours
to Dr. Pool Knudson, who verbally acknowledged these results.

## 2014-01-17 IMAGING — CR DG CHEST 2V
1 series · 1 of 1 positions shown · non-contrast
Comparison: None.

CLINICAL DATA: Stroke.

CHEST - 2 VIEW

[view not recorded]
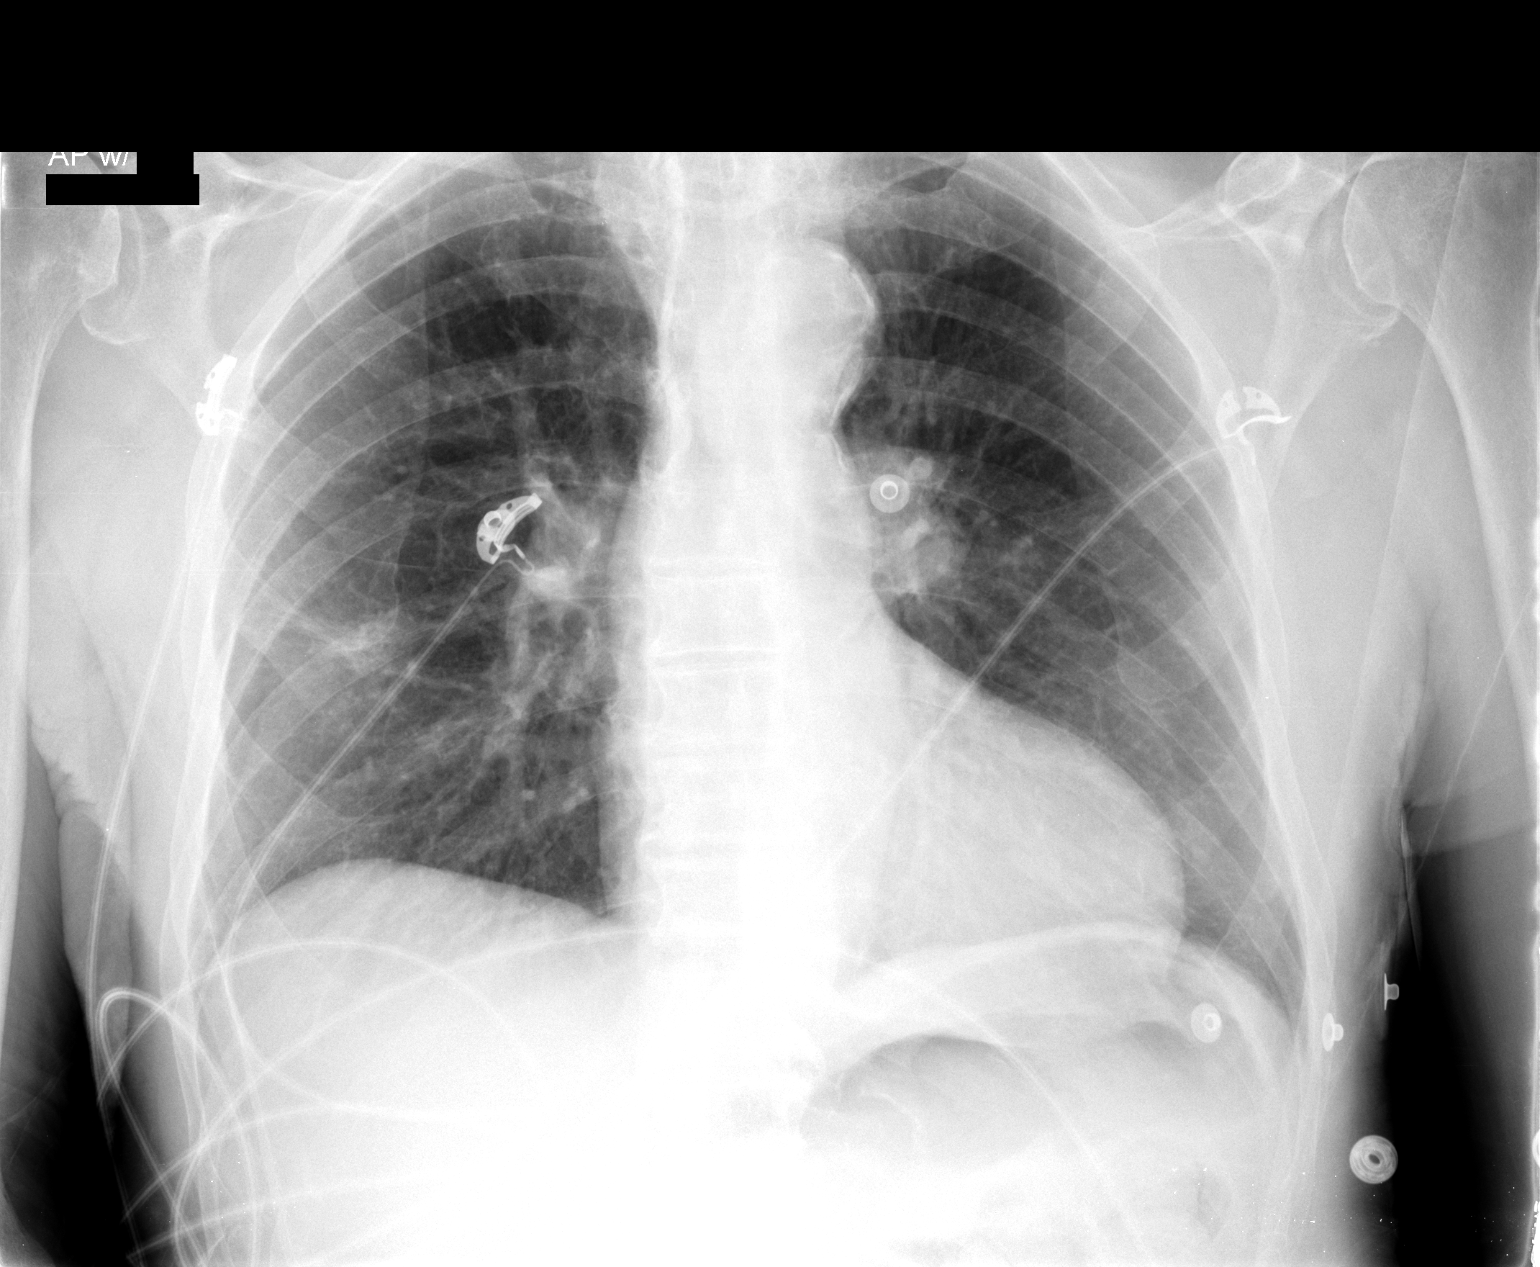

[1 of 1 positions shown; findings below may reference images not displayed]

FINDINGS: Nodular density projects over the right mid lung.  The
left lung is clear.  Heart is normal size.  No effusions.  No acute
bony abnormality.
IMPRESSION: Nodular density projects over the right mid lung.  Cannot exclude
pulmonary nodule.  Consider further evaluation with chest CT when
the patient is clinically able.

## 2014-01-19 ENCOUNTER — Ambulatory Visit (INDEPENDENT_AMBULATORY_CARE_PROVIDER_SITE_OTHER): Payer: Medicare Other | Admitting: Pharmacist

## 2014-01-19 DIAGNOSIS — I4891 Unspecified atrial fibrillation: Secondary | ICD-10-CM

## 2014-01-19 DIAGNOSIS — I639 Cerebral infarction, unspecified: Secondary | ICD-10-CM

## 2014-01-19 DIAGNOSIS — I6350 Cerebral infarction due to unspecified occlusion or stenosis of unspecified cerebral artery: Secondary | ICD-10-CM

## 2014-01-19 DIAGNOSIS — Z7901 Long term (current) use of anticoagulants: Secondary | ICD-10-CM

## 2014-01-19 LAB — POCT INR: INR: 2.6

## 2014-02-23 ENCOUNTER — Other Ambulatory Visit: Payer: Self-pay | Admitting: Internal Medicine

## 2014-03-02 ENCOUNTER — Ambulatory Visit (INDEPENDENT_AMBULATORY_CARE_PROVIDER_SITE_OTHER): Payer: Medicare Other

## 2014-03-02 DIAGNOSIS — I6350 Cerebral infarction due to unspecified occlusion or stenosis of unspecified cerebral artery: Secondary | ICD-10-CM

## 2014-03-02 DIAGNOSIS — I639 Cerebral infarction, unspecified: Secondary | ICD-10-CM

## 2014-03-02 DIAGNOSIS — Z7901 Long term (current) use of anticoagulants: Secondary | ICD-10-CM

## 2014-03-02 DIAGNOSIS — I4891 Unspecified atrial fibrillation: Secondary | ICD-10-CM

## 2014-03-02 LAB — POCT INR: INR: 2.8

## 2014-03-02 MED ORDER — SIMVASTATIN 10 MG PO TABS: 10.0000 mg | ORAL_TABLET | Freq: Every day | ORAL | Status: DC

## 2014-03-18 ENCOUNTER — Institutional Professional Consult (permissible substitution): Payer: Medicare Other | Admitting: Cardiovascular Disease

## 2014-03-29 ENCOUNTER — Ambulatory Visit (INDEPENDENT_AMBULATORY_CARE_PROVIDER_SITE_OTHER): Payer: Medicare Other | Admitting: Cardiovascular Disease

## 2014-03-29 ENCOUNTER — Encounter: Payer: Self-pay | Admitting: Cardiovascular Disease

## 2014-03-29 VITALS — BP 130/80 | HR 63 | Ht 67.0 in | Wt 143.8 lb

## 2014-03-29 DIAGNOSIS — I639 Cerebral infarction, unspecified: Secondary | ICD-10-CM

## 2014-03-29 DIAGNOSIS — I6529 Occlusion and stenosis of unspecified carotid artery: Secondary | ICD-10-CM

## 2014-03-29 DIAGNOSIS — I6350 Cerebral infarction due to unspecified occlusion or stenosis of unspecified cerebral artery: Secondary | ICD-10-CM

## 2014-03-29 DIAGNOSIS — Z7901 Long term (current) use of anticoagulants: Secondary | ICD-10-CM

## 2014-03-29 DIAGNOSIS — I10 Essential (primary) hypertension: Secondary | ICD-10-CM

## 2014-03-29 DIAGNOSIS — I4891 Unspecified atrial fibrillation: Secondary | ICD-10-CM

## 2014-03-29 MED ORDER — WARFARIN SODIUM 5 MG PO TABS: 5.0000 mg | ORAL_TABLET | Freq: Every day | ORAL | Status: DC

## 2014-03-29 NOTE — Assessment & Plan Note (Signed)
Blood pressure is well controlled on today's visit. No changes made to the medications. 

## 2014-03-29 NOTE — Assessment & Plan Note (Signed)
Remote history of stroke in 2013. Currently on anticoagulation for paroxysmal atrial fibrillation. No further neurologic symptoms

## 2014-03-29 NOTE — Progress Notes (Signed)
   Patient ID: Norman Dickerson, male    DOB: 1920-06-13, 80 y.o.   MRN: [id number]  HPI Comments:  80 yo with a history of paroxysmal atrial fibrillation, prior stroke at the end of 2013, previous unsuccessful attempt of a TEE  Unable to pass probe.   The patient was d/c'd home on Plavix and ASA following his stroke, with holter monitor showing atrial fibrillation. He presents for routine followup and to establish care in the [place] office.  In followup today, he reports that he is doing well. His wife presents with him today and reports that he is reactive with no complaints of chest pain, lightheadedness. He does have a history of falls though these are rare. He denies any leg edema. He is typically very active in the garden. Bp well controlled at home. He does not appreciate any tachycardia or palpitations. In the past he did not appreciate atrial fibrillation.  EKG today shows normal sinus rhythm with rate 62 beats per minute with ST abnormality in V6, 3, aVF   Outpatient Encounter Prescriptions as of 03/29/2014  Medication Sig  . INDOMETHACIN PO Take by mouth as needed.  Marland Kitchen. omeprazole (PRILOSEC) 20 MG capsule Take 20 mg by mouth daily.  . simvastatin (ZOCOR) 10 MG tablet Take 1 tablet (10 mg total) by mouth daily.  Marland Kitchen. warfarin (COUMADIN) 5 MG tablet Take 1 tablet (5 mg total) by mouth daily. 1.5 on Mon and Fri   Review of Systems  Constitutional: Negative.   HENT: Negative.   Eyes: Negative.   Respiratory: Negative.   Cardiovascular: Negative.   Gastrointestinal: Negative.   Endocrine: Negative.   Musculoskeletal: Negative.   Skin: Negative.   Allergic/Immunologic: Negative.   Neurological: Negative.   Hematological: Negative.   Psychiatric/Behavioral: Negative.   All other systems reviewed and are negative.   BP 130/80  Pulse 63  Ht 5\' 7"  (1.702 m)  Wt 143 lb 12 oz (65.205 kg)  BMI 22.51 kg/m2   Physical Exam  Nursing note and vitals reviewed. Constitutional: He is  oriented to person, place, and time. He appears well-developed and well-nourished.  HENT:  Head: Normocephalic.  Nose: Nose normal.  Mouth/Throat: Oropharynx is clear and moist.  Eyes: Conjunctivae are normal. Pupils are equal, round, and reactive to light.  Neck: Normal range of motion. Neck supple. No JVD present.  Cardiovascular: Normal rate, regular rhythm, S1 normal, S2 normal, normal heart sounds and intact distal pulses.  Exam reveals no gallop and no friction rub.   No murmur heard. Pulmonary/Chest: Effort normal and breath sounds normal. No respiratory distress. He has no wheezes. He has no rales. He exhibits no tenderness.  Abdominal: Soft. Bowel sounds are normal. He exhibits no distension. There is no tenderness.  Musculoskeletal: Normal range of motion. He exhibits no edema and no tenderness.  Lymphadenopathy:    He has no cervical adenopathy.  Neurological: He is alert and oriented to person, place, and time. Coordination normal.  Skin: Skin is warm and dry. No rash noted. No erythema.  Psychiatric: He has a normal mood and affect. His behavior is normal. Judgment and thought content normal.      Assessment and Plan

## 2014-03-29 NOTE — Assessment & Plan Note (Addendum)
We'll continue on a statin. Most recent lipid panel not available. Goal LDL less than 70 Consider repeat carotid ultrasound on his next visit

## 2014-03-29 NOTE — Assessment & Plan Note (Addendum)
He is asymptomatic from his paroxysmal atrial fibrillation. We'll continue him on anticoagulation. No recent falls. Heart rate on the low side, blood pressure well controlled. As heart rate appears to be well controlled, will avoid beta blockers unless he has additional episodes of documented atrial fibrillation.

## 2014-03-29 NOTE — Patient Instructions (Signed)
You are doing well. No medication changes were made.  Please call us if you have new issues that need to be addressed before your next appt.  Your physician wants you to follow-up in: 12 months.  You will receive a reminder letter in the mail two months in advance. If you don't receive a letter, please call our office to schedule the follow-up appointment. 

## 2014-03-29 NOTE — Assessment & Plan Note (Signed)
Currently tolerating anticoagulation. No recent falls. He does have a remote fall

## 2014-04-13 ENCOUNTER — Ambulatory Visit (INDEPENDENT_AMBULATORY_CARE_PROVIDER_SITE_OTHER): Payer: Medicare Other

## 2014-04-13 DIAGNOSIS — I4891 Unspecified atrial fibrillation: Secondary | ICD-10-CM

## 2014-04-13 DIAGNOSIS — I639 Cerebral infarction, unspecified: Secondary | ICD-10-CM

## 2014-04-13 DIAGNOSIS — Z7901 Long term (current) use of anticoagulants: Secondary | ICD-10-CM

## 2014-04-13 DIAGNOSIS — I6350 Cerebral infarction due to unspecified occlusion or stenosis of unspecified cerebral artery: Secondary | ICD-10-CM

## 2014-04-13 LAB — POCT INR: INR: 1.8

## 2014-05-04 ENCOUNTER — Telehealth: Payer: Self-pay

## 2014-05-04 NOTE — Telephone Encounter (Signed)
Pt called and needs to "change my amount of medication before next week". Please call.

## 2014-05-04 NOTE — Telephone Encounter (Signed)
Spoke with pt scheduled to have roots of tooth removed next Wednesday.  Pt states oral surgeon wants INR >2.0.  Advised pt to take 1/2 tablet on Monday 05/09/14, eat something green on Tuesday, then check INR on Wednesday prior to oral surgeon appt.  Pt has a hx of stroke and afib will not hold Coumadin just reduce dosage right before procedure slightly target INR 1.8-1.9 and then boost immediately after. Discussed with Dr Mariah Milling, he agrees with plan.

## 2014-05-11 ENCOUNTER — Ambulatory Visit (INDEPENDENT_AMBULATORY_CARE_PROVIDER_SITE_OTHER): Payer: Medicare Other

## 2014-05-11 DIAGNOSIS — Z7901 Long term (current) use of anticoagulants: Secondary | ICD-10-CM

## 2014-05-11 DIAGNOSIS — I4891 Unspecified atrial fibrillation: Secondary | ICD-10-CM

## 2014-05-11 DIAGNOSIS — I639 Cerebral infarction, unspecified: Secondary | ICD-10-CM

## 2014-05-11 DIAGNOSIS — I6350 Cerebral infarction due to unspecified occlusion or stenosis of unspecified cerebral artery: Secondary | ICD-10-CM

## 2014-05-11 LAB — POCT INR: INR: 2.1

## 2014-05-26 ENCOUNTER — Other Ambulatory Visit: Payer: Self-pay | Admitting: *Deleted

## 2014-05-26 MED ORDER — WARFARIN SODIUM 5 MG PO TABS: ORAL_TABLET | ORAL | Status: DC

## 2014-05-27 ENCOUNTER — Other Ambulatory Visit: Payer: Self-pay | Admitting: *Deleted

## 2014-05-27 ENCOUNTER — Encounter: Payer: Self-pay | Admitting: *Deleted

## 2014-05-27 ENCOUNTER — Other Ambulatory Visit: Payer: Self-pay | Admitting: Internal Medicine

## 2014-05-27 MED ORDER — SIMVASTATIN 10 MG PO TABS: 10.0000 mg | ORAL_TABLET | Freq: Every day | ORAL | Status: DC

## 2014-05-27 NOTE — Telephone Encounter (Signed)
Requested Prescriptions   Signed Prescriptions Disp Refills  . simvastatin (ZOCOR) 10 MG tablet 90 tablet 3    Sig: Take 1 tablet (10 mg total) by mouth daily.    Authorizing Provider: Antonieta IbaGOLLAN, TIMOTHY J    Ordering User: Kendrick FriesLOPEZ, MARINA C

## 2014-06-01 ENCOUNTER — Ambulatory Visit (INDEPENDENT_AMBULATORY_CARE_PROVIDER_SITE_OTHER): Payer: Medicare Other

## 2014-06-01 DIAGNOSIS — I639 Cerebral infarction, unspecified: Secondary | ICD-10-CM

## 2014-06-01 DIAGNOSIS — I6350 Cerebral infarction due to unspecified occlusion or stenosis of unspecified cerebral artery: Secondary | ICD-10-CM

## 2014-06-01 DIAGNOSIS — I4891 Unspecified atrial fibrillation: Secondary | ICD-10-CM

## 2014-06-01 DIAGNOSIS — Z7901 Long term (current) use of anticoagulants: Secondary | ICD-10-CM

## 2014-06-01 LAB — POCT INR: INR: 1.9

## 2014-06-07 NOTE — Telephone Encounter (Signed)
This encounter was created in error - please disregard.

## 2014-06-23 NOTE — Telephone Encounter (Signed)
This encounter was created in error - please disregard.

## 2014-07-13 ENCOUNTER — Ambulatory Visit (INDEPENDENT_AMBULATORY_CARE_PROVIDER_SITE_OTHER): Payer: Medicare Other

## 2014-07-13 DIAGNOSIS — I6350 Cerebral infarction due to unspecified occlusion or stenosis of unspecified cerebral artery: Secondary | ICD-10-CM

## 2014-07-13 DIAGNOSIS — I639 Cerebral infarction, unspecified: Secondary | ICD-10-CM

## 2014-07-13 DIAGNOSIS — I4891 Unspecified atrial fibrillation: Secondary | ICD-10-CM

## 2014-07-13 DIAGNOSIS — Z7901 Long term (current) use of anticoagulants: Secondary | ICD-10-CM

## 2014-07-13 LAB — POCT INR: INR: 2.1

## 2014-08-17 ENCOUNTER — Ambulatory Visit: Payer: Self-pay | Admitting: Internal Medicine

## 2014-08-24 ENCOUNTER — Ambulatory Visit (INDEPENDENT_AMBULATORY_CARE_PROVIDER_SITE_OTHER): Payer: Medicare Other

## 2014-08-24 DIAGNOSIS — Z7901 Long term (current) use of anticoagulants: Secondary | ICD-10-CM

## 2014-08-24 DIAGNOSIS — I639 Cerebral infarction, unspecified: Secondary | ICD-10-CM

## 2014-08-24 DIAGNOSIS — I6350 Cerebral infarction due to unspecified occlusion or stenosis of unspecified cerebral artery: Secondary | ICD-10-CM

## 2014-08-24 DIAGNOSIS — I4891 Unspecified atrial fibrillation: Secondary | ICD-10-CM

## 2014-08-24 LAB — POCT INR: INR: 2.4

## 2014-10-05 ENCOUNTER — Ambulatory Visit (INDEPENDENT_AMBULATORY_CARE_PROVIDER_SITE_OTHER): Payer: Medicare Other

## 2014-10-05 DIAGNOSIS — Z7901 Long term (current) use of anticoagulants: Secondary | ICD-10-CM

## 2014-10-05 DIAGNOSIS — I639 Cerebral infarction, unspecified: Secondary | ICD-10-CM

## 2014-10-05 DIAGNOSIS — I4891 Unspecified atrial fibrillation: Secondary | ICD-10-CM

## 2014-10-05 LAB — POCT INR: INR: 2

## 2014-11-16 ENCOUNTER — Ambulatory Visit (INDEPENDENT_AMBULATORY_CARE_PROVIDER_SITE_OTHER): Payer: Medicare Other

## 2014-11-16 DIAGNOSIS — I4891 Unspecified atrial fibrillation: Secondary | ICD-10-CM

## 2014-11-16 DIAGNOSIS — Z7901 Long term (current) use of anticoagulants: Secondary | ICD-10-CM

## 2014-11-16 DIAGNOSIS — I639 Cerebral infarction, unspecified: Secondary | ICD-10-CM

## 2014-11-16 LAB — POCT INR: INR: 2.9

## 2014-12-27 ENCOUNTER — Other Ambulatory Visit: Payer: Self-pay | Admitting: Internal Medicine

## 2014-12-28 ENCOUNTER — Ambulatory Visit (INDEPENDENT_AMBULATORY_CARE_PROVIDER_SITE_OTHER): Payer: Medicare Other

## 2014-12-28 DIAGNOSIS — Z7901 Long term (current) use of anticoagulants: Secondary | ICD-10-CM

## 2014-12-28 DIAGNOSIS — I639 Cerebral infarction, unspecified: Secondary | ICD-10-CM

## 2014-12-28 DIAGNOSIS — I4891 Unspecified atrial fibrillation: Secondary | ICD-10-CM

## 2014-12-28 LAB — POCT INR: INR: 2.1

## 2014-12-28 MED ORDER — WARFARIN SODIUM 5 MG PO TABS: ORAL_TABLET | ORAL | Status: DC

## 2015-02-08 ENCOUNTER — Ambulatory Visit (INDEPENDENT_AMBULATORY_CARE_PROVIDER_SITE_OTHER): Payer: Medicare Other | Admitting: *Deleted

## 2015-02-08 DIAGNOSIS — Z7901 Long term (current) use of anticoagulants: Secondary | ICD-10-CM

## 2015-02-08 DIAGNOSIS — I4891 Unspecified atrial fibrillation: Secondary | ICD-10-CM

## 2015-02-08 DIAGNOSIS — I639 Cerebral infarction, unspecified: Secondary | ICD-10-CM

## 2015-02-08 LAB — POCT INR: INR: 1.8

## 2015-02-17 ENCOUNTER — Other Ambulatory Visit: Payer: Self-pay

## 2015-02-17 MED ORDER — SIMVASTATIN 10 MG PO TABS: 10.0000 mg | ORAL_TABLET | Freq: Every day | ORAL | Status: DC

## 2015-02-17 NOTE — Telephone Encounter (Signed)
Refill sent for simvastatin.  

## 2015-03-15 ENCOUNTER — Ambulatory Visit (INDEPENDENT_AMBULATORY_CARE_PROVIDER_SITE_OTHER): Payer: Medicare Other

## 2015-03-15 DIAGNOSIS — I4891 Unspecified atrial fibrillation: Secondary | ICD-10-CM | POA: Diagnosis not present

## 2015-03-15 DIAGNOSIS — Z7901 Long term (current) use of anticoagulants: Secondary | ICD-10-CM | POA: Diagnosis not present

## 2015-03-15 DIAGNOSIS — I639 Cerebral infarction, unspecified: Secondary | ICD-10-CM | POA: Diagnosis not present

## 2015-03-15 LAB — POCT INR: INR: 3.4

## 2015-03-30 ENCOUNTER — Encounter: Payer: Self-pay | Admitting: Cardiovascular Disease

## 2015-03-30 ENCOUNTER — Ambulatory Visit (INDEPENDENT_AMBULATORY_CARE_PROVIDER_SITE_OTHER): Payer: Medicare Other | Admitting: Cardiovascular Disease

## 2015-03-30 VITALS — BP 130/80 | HR 67 | Ht 67.0 in | Wt 136.8 lb

## 2015-03-30 DIAGNOSIS — E785 Hyperlipidemia, unspecified: Secondary | ICD-10-CM

## 2015-03-30 DIAGNOSIS — I6521 Occlusion and stenosis of right carotid artery: Secondary | ICD-10-CM

## 2015-03-30 DIAGNOSIS — I480 Paroxysmal atrial fibrillation: Secondary | ICD-10-CM | POA: Diagnosis not present

## 2015-03-30 DIAGNOSIS — Z7189 Other specified counseling: Secondary | ICD-10-CM | POA: Diagnosis not present

## 2015-03-30 NOTE — Patient Instructions (Signed)
You are doing well. No medication changes were made.  Please call us if you have new issues that need to be addressed before your next appt.  Your physician wants you to follow-up in: 12 months.  You will receive a reminder letter in the mail two months in advance. If you don't receive a letter, please call our office to schedule the follow-up appointment. 

## 2015-03-30 NOTE — Assessment & Plan Note (Signed)
He declined EKG on today's visit but clinically appears to be maintaining normal sinus rhythm. No medication changes made

## 2015-03-30 NOTE — Assessment & Plan Note (Signed)
He is tolerating warfarin. INR within a reasonable range, no bleeding

## 2015-03-30 NOTE — Assessment & Plan Note (Signed)
Bruit appreciated on exam today on the right. Known moderate carotid disease Recommended continued aggressive cholesterol management. Discussed repeating carotid ultrasound on a later visit

## 2015-03-30 NOTE — Assessment & Plan Note (Signed)
Cholesterol is at goal on the current lipid regimen. No changes to the medications were made.  

## 2015-03-30 NOTE — Progress Notes (Addendum)
Patient ID: Norman Dickerson, male    DOB: 05-02-1920, 81 y.o.   MRN: [id number]  HPI Comments:  81 yo with a history of parGlenda Chromanoxysmal atrial fibrillation, prior stroke at the end of 2013, previous unsuccessful attempt of a TEE  Unable to pass probe.   The patient was d/c'd home on Plavix and ASA following his stroke,   holter monitor showing atrial fibrillation, Changed to warfarin , he was asymptomatic He presents for routine followup of his atrial fibrillation  In general he reports that he is feeling well. Denies having any symptoms. Work 7 hours in his garden yesterday Denies having any shortness of breath, chest pain with exertion, or palpitations No lower extremity edema  EKG was not performed at patient's request   No Known Allergies  Outpatient Encounter Prescriptions as of 03/30/2015  Medication Sig  . INDOMETHACIN PO Take by mouth as needed.  Marland Kitchen. omeprazole (PRILOSEC) 20 MG capsule Take 20 mg by mouth daily as needed.   . simvastatin (ZOCOR) 10 MG tablet Take 1 tablet (10 mg total) by mouth daily.  Marland Kitchen. warfarin (COUMADIN) 5 MG tablet TAKE BY MOUTH AS DIRECTED BY COUMADIN CLINIC.    Past Medical History  Diagnosis Date  . GERD (gastroesophageal reflux disease)   . Hypertension   . Dysphagia   . Hematuria   . Hyperlipidemia     Past Surgical History  Procedure Laterality Date  . Hip surgery  2002  . Shoulder surgery  2005  . Tee without cardioversion  10/29/2012    Procedure: TRANSESOPHAGEAL ECHOCARDIOGRAM (TEE);  Surgeon: Pricilla RifflePaula V Ross, MD;  [facility];  Service: Cardiovascular;  Laterality: N/A;    Social History  reports that he quit smoking about 81 years ago. He does not have any smokeless tobacco history on file. He reports that he does not drink alcohol.  Family History Family history is unknown by patient.       Review of Systems  Constitutional: Negative.   Respiratory: Negative.   Cardiovascular: Negative.   Gastrointestinal: Negative.    Musculoskeletal: Negative.   Skin: Negative.   Neurological: Negative.   Hematological: Negative.   Psychiatric/Behavioral: Negative.   All other systems reviewed and are negative.   BP 130/80 mmHg  Pulse 67  Ht 5\' 7"  (1.702 m)  Wt 136 lb 12 oz (62.029 kg)  BMI 21.41 kg/m2  SpO2 98%  Physical Exam  Constitutional: He is oriented to person, place, and time. He appears well-developed and well-nourished.  HENT:  Head: Normocephalic.  Nose: Nose normal.  Mouth/Throat: Oropharynx is clear and moist.  Eyes: Conjunctivae are normal. Pupils are equal, round, and reactive to light.  Neck: Normal range of motion. Neck supple. No JVD present. Carotid bruit is present.  Cardiovascular: Normal rate, regular rhythm, S1 normal, S2 normal, normal heart sounds and intact distal pulses.  Exam reveals no gallop and no friction rub.   No murmur heard. Pulmonary/Chest: Effort normal and breath sounds normal. No respiratory distress. He has no wheezes. He has no rales. He exhibits no tenderness.  Abdominal: Soft. Bowel sounds are normal. He exhibits no distension. There is no tenderness.  Musculoskeletal: Normal range of motion. He exhibits no edema or tenderness.  Lymphadenopathy:    He has no cervical adenopathy.  Neurological: He is alert and oriented to person, place, and time. Coordination normal.  Skin: Skin is warm and dry. No rash noted. No erythema.  Psychiatric: He has a normal mood and affect. His behavior  is normal. Judgment and thought content normal.      Assessment and Plan   Nursing note and vitals reviewed.

## 2015-04-05 ENCOUNTER — Ambulatory Visit (INDEPENDENT_AMBULATORY_CARE_PROVIDER_SITE_OTHER): Payer: Medicare Other | Admitting: *Deleted

## 2015-04-05 DIAGNOSIS — I4891 Unspecified atrial fibrillation: Secondary | ICD-10-CM | POA: Diagnosis not present

## 2015-04-05 DIAGNOSIS — Z7189 Other specified counseling: Secondary | ICD-10-CM

## 2015-04-05 DIAGNOSIS — I480 Paroxysmal atrial fibrillation: Secondary | ICD-10-CM

## 2015-04-05 DIAGNOSIS — Z7901 Long term (current) use of anticoagulants: Secondary | ICD-10-CM | POA: Diagnosis not present

## 2015-04-05 DIAGNOSIS — I639 Cerebral infarction, unspecified: Secondary | ICD-10-CM

## 2015-04-05 LAB — POCT INR: INR: 2.1

## 2015-04-26 ENCOUNTER — Ambulatory Visit (INDEPENDENT_AMBULATORY_CARE_PROVIDER_SITE_OTHER): Payer: Medicare Other

## 2015-04-26 DIAGNOSIS — I4891 Unspecified atrial fibrillation: Secondary | ICD-10-CM | POA: Diagnosis not present

## 2015-04-26 DIAGNOSIS — Z7901 Long term (current) use of anticoagulants: Secondary | ICD-10-CM

## 2015-04-26 DIAGNOSIS — Z7189 Other specified counseling: Secondary | ICD-10-CM

## 2015-04-26 DIAGNOSIS — I639 Cerebral infarction, unspecified: Secondary | ICD-10-CM | POA: Diagnosis not present

## 2015-04-26 LAB — POCT INR: INR: 2.1

## 2015-05-15 ENCOUNTER — Ambulatory Visit (INDEPENDENT_AMBULATORY_CARE_PROVIDER_SITE_OTHER): Payer: Medicare Other

## 2015-05-15 DIAGNOSIS — I4891 Unspecified atrial fibrillation: Secondary | ICD-10-CM

## 2015-05-15 DIAGNOSIS — I639 Cerebral infarction, unspecified: Secondary | ICD-10-CM

## 2015-05-15 DIAGNOSIS — Z7901 Long term (current) use of anticoagulants: Secondary | ICD-10-CM

## 2015-05-15 DIAGNOSIS — Z7189 Other specified counseling: Secondary | ICD-10-CM | POA: Diagnosis not present

## 2015-05-15 LAB — POCT INR: INR: 3.6

## 2015-05-24 ENCOUNTER — Ambulatory Visit (INDEPENDENT_AMBULATORY_CARE_PROVIDER_SITE_OTHER): Payer: Medicare Other

## 2015-05-24 DIAGNOSIS — Z7189 Other specified counseling: Secondary | ICD-10-CM

## 2015-05-24 DIAGNOSIS — Z7901 Long term (current) use of anticoagulants: Secondary | ICD-10-CM | POA: Diagnosis not present

## 2015-05-24 DIAGNOSIS — I639 Cerebral infarction, unspecified: Secondary | ICD-10-CM | POA: Diagnosis not present

## 2015-05-24 DIAGNOSIS — I4891 Unspecified atrial fibrillation: Secondary | ICD-10-CM | POA: Diagnosis not present

## 2015-05-24 LAB — POCT INR: INR: 2.4

## 2015-06-07 ENCOUNTER — Ambulatory Visit (INDEPENDENT_AMBULATORY_CARE_PROVIDER_SITE_OTHER): Payer: Medicare Other

## 2015-06-07 DIAGNOSIS — I639 Cerebral infarction, unspecified: Secondary | ICD-10-CM

## 2015-06-07 DIAGNOSIS — Z7901 Long term (current) use of anticoagulants: Secondary | ICD-10-CM

## 2015-06-07 DIAGNOSIS — I4891 Unspecified atrial fibrillation: Secondary | ICD-10-CM

## 2015-06-07 DIAGNOSIS — Z7189 Other specified counseling: Secondary | ICD-10-CM

## 2015-06-07 LAB — POCT INR: INR: 2

## 2015-07-05 ENCOUNTER — Ambulatory Visit (INDEPENDENT_AMBULATORY_CARE_PROVIDER_SITE_OTHER): Payer: Medicare Other

## 2015-07-05 DIAGNOSIS — I639 Cerebral infarction, unspecified: Secondary | ICD-10-CM

## 2015-07-05 DIAGNOSIS — Z7901 Long term (current) use of anticoagulants: Secondary | ICD-10-CM | POA: Diagnosis not present

## 2015-07-05 DIAGNOSIS — I4891 Unspecified atrial fibrillation: Secondary | ICD-10-CM | POA: Diagnosis not present

## 2015-07-05 DIAGNOSIS — Z7189 Other specified counseling: Secondary | ICD-10-CM | POA: Diagnosis not present

## 2015-07-05 LAB — POCT INR: INR: 3.6

## 2015-07-26 ENCOUNTER — Ambulatory Visit (INDEPENDENT_AMBULATORY_CARE_PROVIDER_SITE_OTHER): Payer: Medicare Other

## 2015-07-26 DIAGNOSIS — I639 Cerebral infarction, unspecified: Secondary | ICD-10-CM | POA: Diagnosis not present

## 2015-07-26 DIAGNOSIS — Z7901 Long term (current) use of anticoagulants: Secondary | ICD-10-CM

## 2015-07-26 DIAGNOSIS — Z7189 Other specified counseling: Secondary | ICD-10-CM | POA: Diagnosis not present

## 2015-07-26 DIAGNOSIS — I4891 Unspecified atrial fibrillation: Secondary | ICD-10-CM

## 2015-07-26 LAB — POCT INR: INR: 2.7

## 2015-08-04 ENCOUNTER — Emergency Department: Payer: Medicare Other

## 2015-08-04 ENCOUNTER — Encounter: Payer: Self-pay | Admitting: Emergency Medicine

## 2015-08-04 ENCOUNTER — Emergency Department
Admission: EM | Admit: 2015-08-04 | Discharge: 2015-08-04 | Disposition: A | Discharge status: Home / Self Care | Payer: Medicare Other | Attending: Emergency Medicine | Admitting: Emergency Medicine

## 2015-08-04 DIAGNOSIS — Z79899 Other long term (current) drug therapy: Secondary | ICD-10-CM | POA: Diagnosis not present

## 2015-08-04 DIAGNOSIS — W1839XA Other fall on same level, initial encounter: Secondary | ICD-10-CM | POA: Diagnosis not present

## 2015-08-04 DIAGNOSIS — Y9389 Activity, other specified: Secondary | ICD-10-CM | POA: Diagnosis not present

## 2015-08-04 DIAGNOSIS — S79911A Unspecified injury of right hip, initial encounter: Secondary | ICD-10-CM | POA: Diagnosis present

## 2015-08-04 DIAGNOSIS — Z7901 Long term (current) use of anticoagulants: Secondary | ICD-10-CM | POA: Insufficient documentation

## 2015-08-04 DIAGNOSIS — S72111A Displaced fracture of greater trochanter of right femur, initial encounter for closed fracture: Secondary | ICD-10-CM | POA: Insufficient documentation

## 2015-08-04 DIAGNOSIS — R42 Dizziness and giddiness: Secondary | ICD-10-CM | POA: Diagnosis not present

## 2015-08-04 DIAGNOSIS — S50311A Abrasion of right elbow, initial encounter: Secondary | ICD-10-CM | POA: Diagnosis not present

## 2015-08-04 DIAGNOSIS — Z87891 Personal history of nicotine dependence: Secondary | ICD-10-CM | POA: Insufficient documentation

## 2015-08-04 DIAGNOSIS — Y998 Other external cause status: Secondary | ICD-10-CM | POA: Insufficient documentation

## 2015-08-04 DIAGNOSIS — Y92009 Unspecified place in unspecified non-institutional (private) residence as the place of occurrence of the external cause: Secondary | ICD-10-CM | POA: Diagnosis not present

## 2015-08-04 DIAGNOSIS — W19XXXA Unspecified fall, initial encounter: Secondary | ICD-10-CM

## 2015-08-04 DIAGNOSIS — I10 Essential (primary) hypertension: Secondary | ICD-10-CM | POA: Diagnosis not present

## 2015-08-04 LAB — CBC
HEMATOCRIT: 38.5 % — AB (ref 40.0–52.0)
Hemoglobin: 12.9 g/dL — ABNORMAL LOW (ref 13.0–18.0)
MCH: 29.9 pg (ref 26.0–34.0)
MCHC: 33.5 g/dL (ref 32.0–36.0)
MCV: 89.2 fL (ref 80.0–100.0)
PLATELETS: 146 10*3/uL — AB (ref 150–440)
RBC: 4.32 MIL/uL — AB (ref 4.40–5.90)
RDW: 13.9 % (ref 11.5–14.5)
WBC: 7.8 10*3/uL (ref 3.8–10.6)

## 2015-08-04 LAB — BASIC METABOLIC PANEL
ANION GAP: 6 (ref 5–15)
BUN: 19 mg/dL (ref 6–20)
CALCIUM: 9 mg/dL (ref 8.9–10.3)
CO2: 28 mmol/L (ref 22–32)
Chloride: 105 mmol/L (ref 101–111)
Creatinine, Ser: 0.9 mg/dL (ref 0.61–1.24)
Glucose, Bld: 92 mg/dL (ref 65–99)
POTASSIUM: 3.8 mmol/L (ref 3.5–5.1)
Sodium: 139 mmol/L (ref 135–145)

## 2015-08-04 LAB — PROTIME-INR
INR: 2.69
Prothrombin Time: 28.7 seconds — ABNORMAL HIGH (ref 11.4–15.0)

## 2015-08-04 MED ORDER — SODIUM CHLORIDE 0.9 % IV BOLUS (SEPSIS)
1000.0000 mL | Freq: Once | INTRAVENOUS | Status: AC
  Administered 2015-08-04: 1000 mL via INTRAVENOUS

## 2015-08-04 NOTE — Discharge Instructions (Signed)
You have been seen in the emergency department today for a fall. He had likely suffered a small fracture of your greater trochanter (a small part of your femur). As we have discussed please avoid any type of movements which elicit pain. Please call Dr. Lutricia Horsfall today to arrange a follow-up appointment this coming week. Please take Tylenol as needed for any discomfort. Return to the emergency department for any worsening pain, difficulty walking, or any other symptom personally concerning to yourself.

## 2015-08-04 NOTE — ED Provider Notes (Signed)
[facility] Emergency Department Provider Note  Time seen: 1:02 PM  I have reviewed the triage vital signs and the nursing notes.   HISTORY  Chief Complaint Fall; Laceration; and Hip Pain    HPI Norman Dickerson is a 81 y.o. male with a past medical history gastric reflux, hypertension, hyperlipidemia, presents to the emergency department after a fall last night. According to the patient he was working outside for several hours yesterday, while putting his tools away he began to feel dizzy and fell backwards. He denies passing out. Denies hitting his head, but the patient does take Coumadin. Patient presents today because he has been having intermittent pain in his right hip. He states he had a hip replacement surgery in 2002. Patient has been ambulatory, but with intermittent pain in his right leg with ambulating. Patient also has an abrasion to his right elbow, but denies any active bleeding.     Past Medical History  Diagnosis Date  . GERD (gastroesophageal reflux disease)   . Hypertension   . Dysphagia   . Hematuria   . Hyperlipidemia     Patient Active Problem List   Diagnosis Date Noted  . Atrial fibrillation 12/28/2012  . Encounter for anticoagulation discussion and counseling 12/28/2012  . Stroke, left 10/22/2012  . Dysphagia 10/22/2012  . Hyperlipidemia 10/22/2012  . Hematuria 10/22/2012  . possible Pulmonary nodule 10/22/2012  . Hypertension 10/22/2012  . Internal carotid artery stenosis, right 40-59% 10/22/2012    Past Surgical History  Procedure Laterality Date  . Hip surgery  2002  . Shoulder surgery  2005  . Tee without cardioversion  10/29/2012    Procedure: TRANSESOPHAGEAL ECHOCARDIOGRAM (TEE);  Surgeon: Pricilla Riffle, MD;  Location: [facility] ENDOSCOPY;  Service: Cardiovascular;  Laterality: N/A;    Current Outpatient Rx  Name  Route  Sig  Dispense  Refill  . INDOMETHACIN PO   Oral   Take by mouth as needed.         Marland Kitchen omeprazole  (PRILOSEC) 20 MG capsule   Oral   Take 20 mg by mouth daily as needed.          . simvastatin (ZOCOR) 10 MG tablet   Oral   Take 1 tablet (10 mg total) by mouth daily.   90 tablet   3   . warfarin (COUMADIN) 5 MG tablet      TAKE BY MOUTH AS DIRECTED BY COUMADIN CLINIC.   60 tablet   3     Allergies Review of patient's allergies indicates no known allergies.  Family History  Problem Relation Age of Onset  . Family history unknown: Yes    Social History Social History  Substance Use Topics  . Smoking status: Former Smoker    Quit date: 12/21/1933  . Smokeless tobacco: None  . Alcohol Use: No    Review of Systems Constitutional: Negative for fever. Cardiovascular: Negative for chest pain. Respiratory: Negative for shortness of breath. Gastrointestinal: Negative for abdominal pain Musculoskeletal: Negative for back pain. Positive for right hip pain. Abrasion to right elbow. Skin: Abrasions right elbow. Neurological: Negative for headaches, focal weakness or numbness.  10-point ROS otherwise negative.  ____________________________________________   PHYSICAL EXAM:  VITAL SIGNS: ED Triage Vitals  Enc Vitals Group     BP 08/04/15 1138 149/65 mmHg     Pulse Rate 08/04/15 1138 64     Resp 08/04/15 1138 20     Temp 08/04/15 1138 98.1 F (36.7 C)  Temp Source 08/04/15 1138 Oral     SpO2 08/04/15 1138 99 %     Weight 08/04/15 1138 135 lb (61.236 kg)     Height 08/04/15 1138 5\' 6"  (1.676 m)     Head Cir --      Peak Flow --      Pain Score 08/04/15 1138 0     Pain Loc --      Pain Edu? --      Excl. in GC? --     Constitutional: Alert and oriented. Well appearing and in no distress. Eyes: Normal exam ENT   Head: Normocephalic and atraumatic   Mouth/Throat: Mucous membranes are moist. Cardiovascular: Normal rate, regular rhythm. No murmur Respiratory: Normal respiratory effort without tachypnea nor retractions. Breath sounds are clear  nontender to palpation. Gastrointestinal: Soft and nontender. No distention.   Musculoskeletal: Nontender. Stable pelvis. Good range of motion, normal range of motion in right hip without pain. Patient states it only hurts sometimes. Neurovascularly intact distally in bilateral lower extremities. He does have a small abrasion to the right elbow, no need for repair. Neurologic:  Normal speech and language. No gross focal neurologic deficits are appreciated. Speech is normal. Skin:  Skin is warm, dry. Small abrasion approximately 1.5 x 1.5 cm right elbow. Full range of motion. Psychiatric: Mood and affect are normal. Speech and behavior are normal.   ____________________________________________    RADIOLOGY  CT within normal limits X-ray shows possible nondisplaced right greater trochanter fracture.  ____________________________________________   INITIAL IMPRESSION / ASSESSMENT AND PLAN / ED COURSE  Pertinent labs & imaging results that were available during my care of the patient were reviewed by me and considered in my medical decision making (see chart for details).  Patient with dizziness, and a fall last night. We will check labs including an INR. We'll obtain a head CT, and a right hip x-ray. Patient is agreeable to plan.  Labs are within normal limits, INR within normal limits. CT head negative. X-ray shows possible nondisplaced fracture of the greater trochanter. We will discharge the patient home. I discussed with the patient he needs to follow-up with Dr. Lutricia Horsfall, who performed his original surgery. Patient will call today to arrange a follow-up appointment this coming week. Otherwise patient may weight-bear as tolerated, I discussed with the patient to avoid any type of movements which elicit pain. Patient agreeable.  ____________________________________________   FINAL CLINICAL IMPRESSION(S) / ED DIAGNOSES  Fall Dizziness Greater trochanter fracture   Minna Antis,  MD 08/04/15 1400

## 2015-08-04 NOTE — ED Notes (Addendum)
Returned from CT then taken to XR

## 2015-08-04 NOTE — ED Notes (Signed)
Pt to ed with c/o right hip pain and right elbow laceration/skin tear after fall yesterday at home.  Pt states he had been working outside and became weak feeling and fell.

## 2015-08-05 ENCOUNTER — Other Ambulatory Visit: Payer: Self-pay | Admitting: Internal Medicine

## 2015-08-10 ENCOUNTER — Telehealth: Payer: Self-pay

## 2015-08-10 NOTE — Telephone Encounter (Signed)
Pt states yesterday he was put on an antibiotic. Please call.

## 2015-08-10 NOTE — Telephone Encounter (Signed)
Returned a call to the patient and he stated he was taking Levaquin  daily for 10 days.  Advised that the medication does interfere with Coumadin and we would need to check his INR.  He verbalized understanding and appointment set.

## 2015-08-11 ENCOUNTER — Ambulatory Visit (INDEPENDENT_AMBULATORY_CARE_PROVIDER_SITE_OTHER): Payer: Medicare Other | Admitting: Pharmacist

## 2015-08-11 DIAGNOSIS — Z7901 Long term (current) use of anticoagulants: Secondary | ICD-10-CM | POA: Diagnosis not present

## 2015-08-11 DIAGNOSIS — I4891 Unspecified atrial fibrillation: Secondary | ICD-10-CM

## 2015-08-11 DIAGNOSIS — I639 Cerebral infarction, unspecified: Secondary | ICD-10-CM

## 2015-08-11 DIAGNOSIS — Z7189 Other specified counseling: Secondary | ICD-10-CM

## 2015-08-11 LAB — POCT INR: INR: 2.6

## 2015-08-23 ENCOUNTER — Ambulatory Visit (INDEPENDENT_AMBULATORY_CARE_PROVIDER_SITE_OTHER): Payer: Medicare Other

## 2015-08-23 DIAGNOSIS — Z7901 Long term (current) use of anticoagulants: Secondary | ICD-10-CM | POA: Diagnosis not present

## 2015-08-23 DIAGNOSIS — I4891 Unspecified atrial fibrillation: Secondary | ICD-10-CM | POA: Diagnosis not present

## 2015-08-23 DIAGNOSIS — Z7189 Other specified counseling: Secondary | ICD-10-CM | POA: Diagnosis not present

## 2015-08-23 DIAGNOSIS — I639 Cerebral infarction, unspecified: Secondary | ICD-10-CM

## 2015-08-23 LAB — POCT INR: INR: 3.1

## 2015-09-13 ENCOUNTER — Ambulatory Visit (INDEPENDENT_AMBULATORY_CARE_PROVIDER_SITE_OTHER): Payer: Medicare Other

## 2015-09-13 DIAGNOSIS — I4891 Unspecified atrial fibrillation: Secondary | ICD-10-CM | POA: Diagnosis not present

## 2015-09-13 DIAGNOSIS — Z7901 Long term (current) use of anticoagulants: Secondary | ICD-10-CM | POA: Diagnosis not present

## 2015-09-13 DIAGNOSIS — Z7189 Other specified counseling: Secondary | ICD-10-CM

## 2015-09-13 DIAGNOSIS — I639 Cerebral infarction, unspecified: Secondary | ICD-10-CM | POA: Diagnosis not present

## 2015-09-13 LAB — POCT INR: INR: 2

## 2015-10-11 ENCOUNTER — Ambulatory Visit (INDEPENDENT_AMBULATORY_CARE_PROVIDER_SITE_OTHER): Payer: Medicare Other

## 2015-10-11 DIAGNOSIS — I4891 Unspecified atrial fibrillation: Secondary | ICD-10-CM | POA: Diagnosis not present

## 2015-10-11 DIAGNOSIS — Z7901 Long term (current) use of anticoagulants: Secondary | ICD-10-CM

## 2015-10-11 DIAGNOSIS — Z7189 Other specified counseling: Secondary | ICD-10-CM

## 2015-10-11 DIAGNOSIS — I639 Cerebral infarction, unspecified: Secondary | ICD-10-CM

## 2015-10-11 LAB — POCT INR: INR: 2.7

## 2015-11-09 ENCOUNTER — Telehealth: Payer: Self-pay | Admitting: Cardiovascular Disease

## 2015-11-09 NOTE — Telephone Encounter (Deleted)
Error

## 2015-11-15 ENCOUNTER — Ambulatory Visit (INDEPENDENT_AMBULATORY_CARE_PROVIDER_SITE_OTHER): Payer: Medicare Other

## 2015-11-15 DIAGNOSIS — Z7901 Long term (current) use of anticoagulants: Secondary | ICD-10-CM | POA: Diagnosis not present

## 2015-11-15 DIAGNOSIS — Z7189 Other specified counseling: Secondary | ICD-10-CM | POA: Diagnosis not present

## 2015-11-15 DIAGNOSIS — I639 Cerebral infarction, unspecified: Secondary | ICD-10-CM

## 2015-11-15 DIAGNOSIS — I4891 Unspecified atrial fibrillation: Secondary | ICD-10-CM

## 2015-11-15 LAB — POCT INR: INR: 4.1

## 2015-11-29 ENCOUNTER — Ambulatory Visit (INDEPENDENT_AMBULATORY_CARE_PROVIDER_SITE_OTHER): Payer: Medicare Other

## 2015-11-29 DIAGNOSIS — Z7189 Other specified counseling: Secondary | ICD-10-CM | POA: Diagnosis not present

## 2015-11-29 DIAGNOSIS — I4891 Unspecified atrial fibrillation: Secondary | ICD-10-CM | POA: Diagnosis not present

## 2015-11-29 DIAGNOSIS — Z7901 Long term (current) use of anticoagulants: Secondary | ICD-10-CM | POA: Diagnosis not present

## 2015-11-29 DIAGNOSIS — I639 Cerebral infarction, unspecified: Secondary | ICD-10-CM | POA: Diagnosis not present

## 2015-11-29 LAB — POCT INR: INR: 2.6

## 2015-12-20 ENCOUNTER — Ambulatory Visit (INDEPENDENT_AMBULATORY_CARE_PROVIDER_SITE_OTHER): Payer: Medicare Other

## 2015-12-20 DIAGNOSIS — Z7901 Long term (current) use of anticoagulants: Secondary | ICD-10-CM

## 2015-12-20 DIAGNOSIS — I4891 Unspecified atrial fibrillation: Secondary | ICD-10-CM

## 2015-12-20 DIAGNOSIS — Z7189 Other specified counseling: Secondary | ICD-10-CM | POA: Diagnosis not present

## 2015-12-20 DIAGNOSIS — I639 Cerebral infarction, unspecified: Secondary | ICD-10-CM

## 2015-12-20 LAB — POCT INR: INR: 1.7

## 2016-01-17 ENCOUNTER — Ambulatory Visit (INDEPENDENT_AMBULATORY_CARE_PROVIDER_SITE_OTHER): Payer: Medicare Other

## 2016-01-17 DIAGNOSIS — Z7189 Other specified counseling: Secondary | ICD-10-CM | POA: Diagnosis not present

## 2016-01-17 DIAGNOSIS — I4891 Unspecified atrial fibrillation: Secondary | ICD-10-CM | POA: Diagnosis not present

## 2016-01-17 DIAGNOSIS — I639 Cerebral infarction, unspecified: Secondary | ICD-10-CM | POA: Diagnosis not present

## 2016-01-17 DIAGNOSIS — Z7901 Long term (current) use of anticoagulants: Secondary | ICD-10-CM | POA: Diagnosis not present

## 2016-01-17 LAB — POCT INR: INR: 2.5

## 2016-02-07 ENCOUNTER — Ambulatory Visit (INDEPENDENT_AMBULATORY_CARE_PROVIDER_SITE_OTHER): Payer: Medicare Other

## 2016-02-07 DIAGNOSIS — Z7901 Long term (current) use of anticoagulants: Secondary | ICD-10-CM

## 2016-02-07 DIAGNOSIS — Z7189 Other specified counseling: Secondary | ICD-10-CM | POA: Diagnosis not present

## 2016-02-07 DIAGNOSIS — I4891 Unspecified atrial fibrillation: Secondary | ICD-10-CM | POA: Diagnosis not present

## 2016-02-07 DIAGNOSIS — I639 Cerebral infarction, unspecified: Secondary | ICD-10-CM

## 2016-02-07 LAB — POCT INR: INR: 3.7

## 2016-02-21 ENCOUNTER — Ambulatory Visit (INDEPENDENT_AMBULATORY_CARE_PROVIDER_SITE_OTHER): Payer: Medicare Other

## 2016-02-21 DIAGNOSIS — I4891 Unspecified atrial fibrillation: Secondary | ICD-10-CM | POA: Diagnosis not present

## 2016-02-21 DIAGNOSIS — Z7901 Long term (current) use of anticoagulants: Secondary | ICD-10-CM | POA: Diagnosis not present

## 2016-02-21 DIAGNOSIS — I639 Cerebral infarction, unspecified: Secondary | ICD-10-CM | POA: Diagnosis not present

## 2016-02-21 DIAGNOSIS — Z7189 Other specified counseling: Secondary | ICD-10-CM | POA: Diagnosis not present

## 2016-02-21 LAB — POCT INR: INR: 2.9

## 2016-03-13 ENCOUNTER — Ambulatory Visit (INDEPENDENT_AMBULATORY_CARE_PROVIDER_SITE_OTHER): Payer: Medicare Other

## 2016-03-13 DIAGNOSIS — I639 Cerebral infarction, unspecified: Secondary | ICD-10-CM | POA: Diagnosis not present

## 2016-03-13 DIAGNOSIS — Z7189 Other specified counseling: Secondary | ICD-10-CM | POA: Diagnosis not present

## 2016-03-13 DIAGNOSIS — Z7901 Long term (current) use of anticoagulants: Secondary | ICD-10-CM | POA: Diagnosis not present

## 2016-03-13 DIAGNOSIS — I4891 Unspecified atrial fibrillation: Secondary | ICD-10-CM

## 2016-03-13 LAB — POCT INR: INR: 1.7

## 2016-03-19 ENCOUNTER — Other Ambulatory Visit: Payer: Self-pay

## 2016-03-19 MED ORDER — SIMVASTATIN 10 MG PO TABS: 10.0000 mg | ORAL_TABLET | Freq: Every day | ORAL | Status: AC

## 2016-03-19 NOTE — Telephone Encounter (Signed)
Refill sent for Simvastatin 10 mg to [facility] for 90 day supply.

## 2016-04-03 ENCOUNTER — Ambulatory Visit (INDEPENDENT_AMBULATORY_CARE_PROVIDER_SITE_OTHER): Payer: Medicare Other

## 2016-04-03 DIAGNOSIS — I4891 Unspecified atrial fibrillation: Secondary | ICD-10-CM | POA: Diagnosis not present

## 2016-04-03 DIAGNOSIS — Z7901 Long term (current) use of anticoagulants: Secondary | ICD-10-CM | POA: Diagnosis not present

## 2016-04-03 DIAGNOSIS — Z7189 Other specified counseling: Secondary | ICD-10-CM | POA: Diagnosis not present

## 2016-04-03 DIAGNOSIS — I639 Cerebral infarction, unspecified: Secondary | ICD-10-CM | POA: Diagnosis not present

## 2016-04-03 LAB — POCT INR: INR: 2.4

## 2016-04-23 ENCOUNTER — Other Ambulatory Visit: Payer: Self-pay | Admitting: *Deleted

## 2016-04-23 MED ORDER — WARFARIN SODIUM 5 MG PO TABS: ORAL_TABLET | ORAL | Status: DC

## 2016-05-01 ENCOUNTER — Ambulatory Visit (INDEPENDENT_AMBULATORY_CARE_PROVIDER_SITE_OTHER): Payer: Medicare Other | Admitting: *Deleted

## 2016-05-01 DIAGNOSIS — Z7189 Other specified counseling: Secondary | ICD-10-CM | POA: Diagnosis not present

## 2016-05-01 DIAGNOSIS — I4891 Unspecified atrial fibrillation: Secondary | ICD-10-CM | POA: Diagnosis not present

## 2016-05-01 DIAGNOSIS — Z7901 Long term (current) use of anticoagulants: Secondary | ICD-10-CM

## 2016-05-01 DIAGNOSIS — I639 Cerebral infarction, unspecified: Secondary | ICD-10-CM | POA: Diagnosis not present

## 2016-05-01 LAB — POCT INR: INR: 2

## 2016-05-29 ENCOUNTER — Ambulatory Visit (INDEPENDENT_AMBULATORY_CARE_PROVIDER_SITE_OTHER): Payer: Medicare Other

## 2016-05-29 DIAGNOSIS — I639 Cerebral infarction, unspecified: Secondary | ICD-10-CM | POA: Diagnosis not present

## 2016-05-29 DIAGNOSIS — I4891 Unspecified atrial fibrillation: Secondary | ICD-10-CM | POA: Diagnosis not present

## 2016-05-29 DIAGNOSIS — Z7901 Long term (current) use of anticoagulants: Secondary | ICD-10-CM

## 2016-05-29 DIAGNOSIS — Z7189 Other specified counseling: Secondary | ICD-10-CM | POA: Diagnosis not present

## 2016-05-29 LAB — POCT INR: INR: 2

## 2016-06-17 ENCOUNTER — Other Ambulatory Visit: Payer: Self-pay | Admitting: Internal Medicine

## 2016-06-17 DIAGNOSIS — R1310 Dysphagia, unspecified: Secondary | ICD-10-CM

## 2016-06-24 ENCOUNTER — Emergency Department
Admission: EM | Admit: 2016-06-24 | Discharge: 2016-06-24 | Disposition: A | Discharge status: Home / Self Care | Payer: Medicare Other | Attending: Emergency Medicine | Admitting: Emergency Medicine

## 2016-06-24 ENCOUNTER — Emergency Department: Payer: Medicare Other

## 2016-06-24 DIAGNOSIS — R001 Bradycardia, unspecified: Secondary | ICD-10-CM | POA: Insufficient documentation

## 2016-06-24 DIAGNOSIS — E785 Hyperlipidemia, unspecified: Secondary | ICD-10-CM | POA: Diagnosis not present

## 2016-06-24 DIAGNOSIS — Z8679 Personal history of other diseases of the circulatory system: Secondary | ICD-10-CM | POA: Insufficient documentation

## 2016-06-24 DIAGNOSIS — Z8673 Personal history of transient ischemic attack (TIA), and cerebral infarction without residual deficits: Secondary | ICD-10-CM | POA: Diagnosis not present

## 2016-06-24 DIAGNOSIS — Z7901 Long term (current) use of anticoagulants: Secondary | ICD-10-CM | POA: Insufficient documentation

## 2016-06-24 DIAGNOSIS — I10 Essential (primary) hypertension: Secondary | ICD-10-CM | POA: Insufficient documentation

## 2016-06-24 DIAGNOSIS — Z79899 Other long term (current) drug therapy: Secondary | ICD-10-CM | POA: Insufficient documentation

## 2016-06-24 DIAGNOSIS — Z87891 Personal history of nicotine dependence: Secondary | ICD-10-CM | POA: Insufficient documentation

## 2016-06-24 DIAGNOSIS — I498 Other specified cardiac arrhythmias: Secondary | ICD-10-CM

## 2016-06-24 DIAGNOSIS — R0602 Shortness of breath: Secondary | ICD-10-CM | POA: Diagnosis present

## 2016-06-24 HISTORY — DX: Cerebral infarction, unspecified: I63.9

## 2016-06-24 LAB — BASIC METABOLIC PANEL
ANION GAP: 5 (ref 5–15)
BUN: 15 mg/dL (ref 6–20)
CALCIUM: 9.1 mg/dL (ref 8.9–10.3)
CHLORIDE: 101 mmol/L (ref 101–111)
CO2: 30 mmol/L (ref 22–32)
Creatinine, Ser: 0.93 mg/dL (ref 0.61–1.24)
GFR calc Af Amer: >60 mL/min (ref >60)
GLUCOSE: 101 mg/dL — AB (ref 65–99)
POTASSIUM: 4.4 mmol/L (ref 3.5–5.1)
Sodium: 136 mmol/L (ref 135–145)

## 2016-06-24 LAB — CBC
HCT: 43.3 % (ref 40.0–52.0)
HEMOGLOBIN: 14.7 g/dL (ref 13.0–18.0)
MCH: 30.9 pg (ref 26.0–34.0)
MCHC: 33.9 g/dL (ref 32.0–36.0)
MCV: 90.9 fL (ref 80.0–100.0)
Platelets: 178 10*3/uL (ref 150–440)
RBC: 4.76 MIL/uL (ref 4.40–5.90)
RDW: 14 % (ref 11.5–14.5)
WBC: 6 10*3/uL (ref 3.8–10.6)

## 2016-06-24 LAB — TROPONIN I

## 2016-06-24 LAB — PROTIME-INR
INR: 2.65
PROTHROMBIN TIME: 27.9 s — AB (ref 11.4–15.0)

## 2016-06-24 NOTE — ED Notes (Signed)
Pt c/o SOB worse with exertion that started this morning.. Denies any pain, in NAD on arrival..

## 2016-06-24 NOTE — Discharge Instructions (Signed)
You were evaluated after an episode of feeling bad and her blood pressures were a little elevated. Your exam and evaluation are reassuring in the emergency department today.  As discussed, your heart rhythm was a little on the lower side, and I do recommend he follow up with your cardiologist Dr. Quillian Quince.  Return to the emergency room for any worsening condition including any trouble breathing, shortness of breath, fever, confusion or altered mental status, weakness or numbness, chest pain or palpitations, passing out, or any other symptoms concerning to you.   Bradycardia Bradycardia is a slower-than-normal heart rate. A normal resting heart rate for an adult ranges from 60 to 100 beats per minute. With bradycardia, the resting heart rate is less than 60 beats per minute. Bradycardia is a problem if your heart cannot pump enough oxygen-rich blood through your body. Bradycardia is not a problem for everyone. For some healthy adults, a slow resting heart rate is normal.  CAUSES  Bradycardia may be caused by:  A problem with the heart's electrical system, such as heart block.  A problem with the heart's natural pacemaker (sinus node).  Heart disease, damage, or infection.  Certain medicines that treat heart conditions.  Certain conditions, such as hypothyroidism and obstructive sleep apnea. RISK FACTORS  Risk factors include:  Being 59 or older.  Having high blood pressure (hypertension), high cholesterol (hyperlipidemia), or diabetes.  Drinking heavily, using tobacco products, or using drugs.  Being stressed. SIGNS AND SYMPTOMS  Signs and symptoms include:  Light-headedness.  Faintingor near fainting.  Fatigue and weakness.  Shortness of breath.  Chest pain (angina).  Drowsiness.  Confusion.  Dizziness. DIAGNOSIS  Diagnosis of bradycardia may include:  A physical exam.  An electrocardiogram (ECG).  Blood tests. TREATMENT  Treatment for bradycardia may  include:  Treatment of an underlying condition.  Pacemaker placement. A pacemaker is a small, battery-powered device that is placed under the skin and is programmed to sense your heartbeats. If your heart rate is lower than the programmed rate, the pacemaker will pace your heart.  Changing your medicines or dosages. HOME CARE INSTRUCTIONS  Take medicines only as directed by your health care provider.  Manage any health conditions that contribute to bradycardia as directed by your health care provider.  Follow a heart-healthy diet. A dietitian can help educate you on healthy food options and changes.  Follow an exercise program approved by your health care provider.  Maintain a healthy weight. Lose weight as approved by your health care provider.  Do not use tobacco products, including cigarettes, chewing tobacco, or electronic cigarettes. If you need help quitting, ask your health care provider.  Do not use illegal drugs.  Limit alcohol intake to no more than 1 drink per day for nonpregnant women and 2 drinks per day for men. One drink equals 12 ounces of beer, 5 ounces of wine, or 1 ounces of hard liquor.  Keep all follow-up visits as directed by your health care provider. This is important. SEEK MEDICAL CARE IF:  You feel light-headed or dizzy.  You almost faint.  You feel weak or are easily fatigued during physical activity.  You experience confusion or have memory problems. SEEK IMMEDIATE MEDICAL CARE IF:   You faint.  You have an irregular heartbeat.  You have chest pain.  You have trouble breathing. MAKE SURE YOU:   Understand these instructions.  Will watch your condition.  Will get help right away if you are not doing well or get worse.  This information is not intended to replace advice given to you by your health care provider. Make sure you discuss any questions you have with your health care provider.   Document Released: 08/17/2002 Document  Revised: 12/16/2014 Document Reviewed: 03/02/2014 Elsevier Interactive Patient Education [employer]2016 Elsevier Inc.

## 2016-06-24 NOTE — ED Provider Notes (Signed)
[facility] Emergency Department Provider Note   ____________________________________________  Time seen:  I have reviewed the triage vital signs and the triage nursing note.  HISTORY  Chief Complaint Shortness of Breath   Historian Patient and his son  HPI Norman Dickerson is a 82 y.o. male with a history of prior stroke for which she has no deficits now, and a history of atrial fibrillation, and he is currently taking Zocor and Coumadin, presents here for a feeling of "not feeling well" this morning. He woke up around 7 AM where he lives at home with his wife, and fixed himself a pot of coffee. He started feeling a little bit short of breath like he couldn't catch his breath, knees had several episodes of this over the last few weeks. He follows with Dr. Orpah Clinton for cardiology. He reports no fever or cough. His son came over after 9 AM and found his blood pressures to be a little high in the 180s over 110 range. The son reported that the heart rate was in the normal range.  Patient states he is followed for microscopic hematuria and he gave a urine sample last week to Dr. Judithann Sheen. He's not had any gross hematuria, syncope or discomfort or dysuria.  No palpitation or chest pain.  He feels better now. There was no weakness or numbness.    Past Medical History  Diagnosis Date  . GERD (gastroesophageal reflux disease)   . Hypertension   . Dysphagia   . Hematuria   . Hyperlipidemia   . Stroke [facility])     Patient Active Problem List   Diagnosis Date Noted  . Atrial fibrillation (HCC) 12/28/2012  . Encounter for anticoagulation discussion and counseling 12/28/2012  . Stroke, left 10/22/2012  . Dysphagia 10/22/2012  . Hyperlipidemia 10/22/2012  . Hematuria 10/22/2012  . possible Pulmonary nodule 10/22/2012  . Hypertension 10/22/2012  . Internal carotid artery stenosis, right 40-59% 10/22/2012    Past Surgical History  Procedure Laterality Date  . Hip  surgery  2002  . Shoulder surgery  2005  . Tee without cardioversion  10/29/2012    Procedure: TRANSESOPHAGEAL ECHOCARDIOGRAM (TEE);  Surgeon: Pricilla Riffle, MD;  Location: [facility] ENDOSCOPY;  Service: Cardiovascular;  Laterality: N/A;    Current Outpatient Rx  Name  Route  Sig  Dispense  Refill  . INDOMETHACIN PO   Oral   Take by mouth as needed.         Marland Kitchen omeprazole (PRILOSEC) 20 MG capsule   Oral   Take 20 mg by mouth daily as needed.          . simvastatin (ZOCOR) 10 MG tablet   Oral   Take 1 tablet (10 mg total) by mouth daily.   90 tablet   3   . warfarin (COUMADIN) 5 MG tablet      TAKE BY MOUTH AS DIRECTED BY COUMADIN CLINIC.   105 tablet   1     105 tablets is 90 day supply     Allergies Review of patient's allergies indicates no known allergies.  Family History  Problem Relation Age of Onset  . Family history unknown: Yes    Social History Social History  Substance Use Topics  . Smoking status: Former Smoker    Quit date: 12/21/1933  . Smokeless tobacco: None  . Alcohol Use: No    Review of Systems  Constitutional: Negative for fever. Eyes: Negative for visual changes. ENT: Negative for sore throat. Cardiovascular:  Negative for chest pain. Respiratory: Negative for shortness of breath. Gastrointestinal: Negative for abdominal pain, vomiting and diarrhea. Genitourinary: Negative for dysuria. Musculoskeletal: Negative for back pain. Skin: Negative for rash. Neurological: Negative for headache. 10 point Review of Systems otherwise negative ____________________________________________   PHYSICAL EXAM:  VITAL SIGNS: ED Triage Vitals  Enc Vitals Group     BP 06/24/16 1224 185/74 mmHg     Pulse Rate 06/24/16 1224 58     Resp 06/24/16 1224 17     Temp 06/24/16 1224 98.1 F (36.7 C)     Temp Source 06/24/16 1224 Oral     SpO2 06/24/16 1224 97 %     Weight 06/24/16 1224 135 lb (61.236 kg)     Height 06/24/16 1224  (1.676 m)     Head  Cir --      Peak Flow --      Pain Score --      Pain Loc --      Pain Edu? --      Excl. in GC? --      Constitutional: Alert and oriented. Well appearing and in no distress. HEENT   Head: Normocephalic and atraumatic.      Eyes: Conjunctivae are normal. PERRL. Normal extraocular movements.      Ears:         Nose: No congestion/rhinnorhea.   Mouth/Throat: Mucous membranes are moist.   Neck: No stridor. Cardiovascular/Chest: Bradycardic and irregularly irregular.  No murmurs, rubs, or gallops. Respiratory: Normal respiratory effort without tachypnea nor retractions. Breath sounds are clear and equal bilaterally. No wheezes/rales/rhonchi. Gastrointestinal: Soft. No distention, no guarding, no rebound. Nontender.    Genitourinary/rectal:Deferred Musculoskeletal: Nontender with normal range of motion in all extremities. No joint effusions.  No lower extremity tenderness.  No edema. Neurologic:  Normal speech and language. No gross or focal neurologic deficits are appreciated. Skin:  Skin is warm, dry and intact. No rash noted. Psychiatric: Mood and affect are normal. Speech and behavior are normal. Patient exhibits appropriate insight and judgment.  ____________________________________________   EKG I, Governor Rooks, MD, the attending physician have personally viewed and interpreted all ECGs.  53 bpm. Sinus bradycardia. Sinus arrhythmia. First-degree AV block. Narrow QRS. Left axis deviation. Normal ST and T-wave. ____________________________________________  LABS (pertinent positives/negatives)  Labs Reviewed  BASIC METABOLIC PANEL - Abnormal; Notable for the following:    Glucose, Bld 101 (*)    All other components within normal limits  PROTIME-INR - Abnormal; Notable for the following:    Prothrombin Time 27.9 (*)    All other components within normal limits  CBC  TROPONIN I    ____________________________________________  RADIOLOGY All Xrays were viewed by  me. Imaging interpreted by Radiologist.  Chest two-view:  IMPRESSION: 1. Stable right mid lung scarring changes. 2. Stable tortuosity, ectasia and calcification of the thoracic aorta. 3. No acute pulmonary findings. __________________________________________  PROCEDURES  Procedure(s) performed: None  Critical Care performed: None  ____________________________________________   ED COURSE / ASSESSMENT AND PLAN  Pertinent labs & imaging results that were available during my care of the patient were reviewed by me and considered in my medical decision making (see chart for details).   This patient is overall well-appearing and feels back to baseline. It sounds like he's had several episodes like this over the past month or so. He is followed by Dr. Judithann Sheen for primary care and Dr. Lewie Loron for cardiology.  He had a very nonspecific episode today that lasted maybe  up to an hour where he is just saying he didn't quite feel well. No specific complaint of palpitations or chest pain. No weakness or numbness concerning for stroke. He did report feeling like he couldn't catch his breath, but this passed and is better now. He's not had any pneumonia or upper respiratory symptoms.  On evaluation here he does have a sinus bradycardia with a sinus arrhythmia and occasional PVC. I have asked him to follow-up with his cardiologist to consider Holter monitoring.  In terms of the complaint of shortness of breath, uncertain etiology, but reassuring exam and evaluation.  I reviewed the recent urinalysis from last week with the patient and his son, very minimal hematuria and otherwise negative.  We discussed discharging him home, for follow-up with cardiology and primary care physician.    CONSULTATIONS:   None   Patient / Family / Caregiver informed of clinical course, medical decision-making process, and agree with plan.   I discussed return precautions, follow-up instructions, and discharged  instructions with patient and/or family.   ___________________________________________   FINAL CLINICAL IMPRESSION(S) / ED DIAGNOSES   Final diagnoses:  Bradycardia  Sinus arrhythmia              Note: This dictation was prepared with Dragon dictation. Any transcriptional errors that result from this process are unintentional   Governor Rooksebecca Amandeep Hogston, MD 06/24/16 1529

## 2016-06-27 ENCOUNTER — Ambulatory Visit
Admission: RE | Admit: 2016-06-27 | Discharge: 2016-06-27 | Disposition: A | Discharge status: Home / Self Care | Payer: Medicare Other | Source: Ambulatory Visit | Attending: Internal Medicine | Admitting: Internal Medicine

## 2016-06-27 DIAGNOSIS — R1310 Dysphagia, unspecified: Secondary | ICD-10-CM | POA: Insufficient documentation

## 2016-06-27 NOTE — Therapy (Addendum)
[facility] DIAGNOSTIC RADIOLOGY [address] [place], [place], [address] Phone: [phone]   Fax:     Modified Barium Swallow  Patient Details  Name: Norman Dickerson MRN: [id number] Date of Birth: 24-Mar-1920 No Data Recorded  Encounter Date: 06/27/2016   Subjective: Patient behavior: (alertness, ability to follow instructions, etc.): pt verbally conversive, A/O x3. Pt able to describe issues w/ his swallowing stating only that he takes his time, and eats small bites. Chief complaint: none given. Pt did report he was told he had what he described as bony protrusions "in my throat" but did not c/o difficulty clearing any foods as long as he took his time and was careful when he ate. Pt does have a dx of GERD per previous chart notes.    Objective:  Radiological Procedure: A videoflouroscopic evaluation of oral-preparatory, reflex initiation, and pharyngeal phases of the swallow was performed; as well as a screening of the upper esophageal phase.  I. POSTURE: upright II. VIEW: lateral III. COMPENSATORY STRATEGIES: f/u, dry swallow post swallowing; alternating food/liquid boluses IV. BOLUSES ADMINISTERED:  Thin Liquid: 5 trials  Nectar-thick Liquid: 1 trial  Honey-thick Liquid: NT  Puree: 3 trials  Mechanical Soft: 1 trial V. RESULTS OF EVALUATION: A. ORAL PREPARATORY PHASE: (The lips, tongue, and velum are observed for strength and coordination)       **Overall Severity Rating: Grossly WFL. Noted adequate bolus management w/ timely A-P transfer; good oral clearing post swallowing. Of note, slight-min premature spillage into the pharynx.   B. SWALLOW INITIATION/REFLEX: (The reflex is normal if "triggered" by the time the bolus reached the base of the tongue)  **Overall Severity Rating: Grossly WFL-MILD. Pt exhibited pharyngeal swallow initiation as the liquid boluses spilled to the pyriform sinuses - moderate spilling. Increased texture(foods)  triggered the pharyngeal swallow moreso at the level of the valleculae.   C. PHARYNGEAL PHASE: (Pharyngeal function is normal if the bolus shows rapid, smooth, and continuous transit through the pharynx and there is no pharyngeal residue after the swallow)  **Overall Severity Rating: MILD+. Pt exhibited min pharyngeal residue in the pyriform sinuses as well as min amount in the valleculae post trials of increased texture moreso. Using strategies of f/u, dry swallowing and alternating food/liquid bolus consistencies appeared to aid reducing/clearing the pharyngeal residue. Suspect this presentation in d/t decreased laryngeal excursion and anterior movement during the swallow as well as min decreased pharyngeal pressure during the swallow.    D. LARYNGEAL PENETRATION: (Material entering into the laryngeal inlet/vestibule but not aspirated): x2 trials w/ thin liquids, and slight amount of laryngeal penetration of bolus material b/t trials d/t pharyngeal residue remaining post initial swallow. E. ASPIRATION: NONE F. ESOPHAGEAL PHASE: (Screening of the upper esophagus): no dysmotility noted in the cervical Esophagus viewed. Noted what appeared to cervical osteophytes which did not appear to impede motility/clearing of bolus material.  ASSESSMENT: Pt exhibited a fairly adequate oropharyngeal swallow function, however, mild pharyngeal phase issues were noted. Inconsistent premature spillage w/ slight delay in pharyngeal swallow initiation noted - moreso w/ thin liquids. This can be expected w/ advanced aging. This resulted in inconsistent laryngeal penetration which appeared to clear for the most part w/ NO aspiration noted to occur during this study. Pt also exhibited min pharyngeal residue post initial swallow w/ increased textured consistencies moreso. Using general strategies of a f/u, dry swallow and alternating food/liquid consistencies appeared to aid reducing/clearing the pharyngeal residue. Suspect this  presentation could be related to min  decreased laryngeal excursion and pharyngeal pressure during the swallowing overall. Again, NO aspiration was noted to occur during this study w/ pt following general aspiration precautions and using general strategy of a f/u, dry swallow to aid reducing any pharyngeal residue remaining.   Recommend pt continue w/ current diet at home w/ the general precautions discussed. Recommend monitoring of Pulmonary status for any decline (recent CXR 06/2016 revealed no acute findings per chart).   PLAN/RECOMMENDATIONS:  A. Diet: continue Regular diet w/ meats cut/moistened well  B. Swallowing Precautions: general aspiration precautions to include small, single sips; sitting fully upright w/ any oral intake; f/u, dry swallows to aid pharyngeal clearing  C. Recommended consultation to: n/a at this time  D. Therapy recommendations: none at this time  E. Results and recommendations were discussed w/ pt; video viewed w/ pt      End of Session - 06/27/16 1558    Visit Number 1   Number of Visits 1   Date for SLP Re-Evaluation 06/27/16   SLP Start Time 1300   SLP Stop Time  1400   SLP Time Calculation (min) 60 min   Activity Tolerance Patient tolerated treatment well      Past Medical History  Diagnosis Date  . GERD (gastroesophageal reflux disease)   . Hypertension   . Dysphagia   . Hematuria   . Hyperlipidemia   . Stroke [facility](HCC)     Past Surgical History  Procedure Laterality Date  . Hip surgery  2002  . Shoulder surgery  2005  . Tee without cardioversion  10/29/2012    Procedure: TRANSESOPHAGEAL ECHOCARDIOGRAM (TEE);  Surgeon: Pricilla RifflePaula V Ross, MD;  [facility];  Service: Cardiovascular;  Laterality: N/A;    There were no vitals filed for this visit.               Dysphagia - Plan: DG OP Swallowing Func-Medicare/Speech Path, DG OP Swallowing Func-Medicare/Speech Path      G-Codes - 06/27/16 1559    Functional Assessment Tool Used  clinical judgement   Functional Limitations Swallowing   Swallow Current Status ([id number]) At least 1 percent but less than 20 percent impaired, limited or restricted   Swallow Goal Status ([id number]) At least 1 percent but less than 20 percent impaired, limited or restricted   Swallow Discharge Status [phone]([id number]) At least 1 percent but less than 20 percent impaired, limited or restricted          Problem List Patient Active Problem List   Diagnosis Date Noted  . Atrial fibrillation (HCC) 12/28/2012  . Encounter for anticoagulation discussion and counseling 12/28/2012  . Stroke, left 10/22/2012  . Dysphagia 10/22/2012  . Hyperlipidemia 10/22/2012  . Hematuria 10/22/2012  . possible Pulmonary nodule 10/22/2012  . Hypertension 10/22/2012  . Internal carotid artery stenosis, right 40-59% 10/22/2012      Jerilynn SomKatherine Watson, MS, CCC-SLP  [facility]  Watson,Katherine 06/27/2016, 4:00 PM  [facility]OLOGY [address] [place], [place], [id number] Phone: [phone]   Fax:     Name: Glenda ChromanHugh A Dickerson MRN: [id number] Date of Birth: 1920-10-04

## 2016-07-03 ENCOUNTER — Encounter: Payer: Self-pay | Admitting: Physician Assistant

## 2016-07-03 ENCOUNTER — Ambulatory Visit (INDEPENDENT_AMBULATORY_CARE_PROVIDER_SITE_OTHER): Payer: Medicare Other

## 2016-07-03 DIAGNOSIS — I480 Paroxysmal atrial fibrillation: Secondary | ICD-10-CM | POA: Insufficient documentation

## 2016-07-03 DIAGNOSIS — Z7189 Other specified counseling: Secondary | ICD-10-CM

## 2016-07-03 DIAGNOSIS — I5032 Chronic diastolic (congestive) heart failure: Secondary | ICD-10-CM | POA: Insufficient documentation

## 2016-07-03 DIAGNOSIS — Z7901 Long term (current) use of anticoagulants: Secondary | ICD-10-CM

## 2016-07-03 DIAGNOSIS — I639 Cerebral infarction, unspecified: Secondary | ICD-10-CM

## 2016-07-03 DIAGNOSIS — I4891 Unspecified atrial fibrillation: Secondary | ICD-10-CM

## 2016-07-03 LAB — POCT INR: INR: 2.5

## 2016-07-05 ENCOUNTER — Ambulatory Visit (INDEPENDENT_AMBULATORY_CARE_PROVIDER_SITE_OTHER): Payer: Medicare Other | Admitting: Physician Assistant

## 2016-07-10 ENCOUNTER — Telehealth: Payer: Self-pay | Admitting: Cardiovascular Disease

## 2016-07-10 NOTE — Telephone Encounter (Signed)
error 

## 2016-08-05 ENCOUNTER — Other Ambulatory Visit: Payer: Self-pay | Admitting: *Deleted

## 2016-08-05 MED ORDER — WARFARIN SODIUM 5 MG PO TABS: ORAL_TABLET | ORAL | 1 refills | Status: AC

## 2016-08-07 ENCOUNTER — Ambulatory Visit (INDEPENDENT_AMBULATORY_CARE_PROVIDER_SITE_OTHER): Payer: Medicare Other | Admitting: *Deleted

## 2016-08-07 DIAGNOSIS — I639 Cerebral infarction, unspecified: Secondary | ICD-10-CM | POA: Diagnosis not present

## 2016-08-07 DIAGNOSIS — Z7189 Other specified counseling: Secondary | ICD-10-CM | POA: Diagnosis not present

## 2016-08-07 DIAGNOSIS — Z7901 Long term (current) use of anticoagulants: Secondary | ICD-10-CM | POA: Diagnosis not present

## 2016-08-07 DIAGNOSIS — I4891 Unspecified atrial fibrillation: Secondary | ICD-10-CM

## 2016-08-07 LAB — POCT INR: INR: 2.5

## 2016-08-16 NOTE — Telephone Encounter (Signed)
Please close Encounter °

## 2016-09-11 ENCOUNTER — Ambulatory Visit (INDEPENDENT_AMBULATORY_CARE_PROVIDER_SITE_OTHER): Payer: Medicare Other | Admitting: Cardiovascular Disease

## 2016-09-11 ENCOUNTER — Encounter: Payer: Self-pay | Admitting: Cardiovascular Disease

## 2016-09-11 VITALS — BP 130/82 | HR 62 | Ht 66.0 in | Wt 130.5 lb

## 2016-09-11 DIAGNOSIS — I5032 Chronic diastolic (congestive) heart failure: Secondary | ICD-10-CM | POA: Diagnosis not present

## 2016-09-11 DIAGNOSIS — E7800 Pure hypercholesterolemia, unspecified: Secondary | ICD-10-CM

## 2016-09-11 DIAGNOSIS — I6521 Occlusion and stenosis of right carotid artery: Secondary | ICD-10-CM | POA: Diagnosis not present

## 2016-09-11 DIAGNOSIS — I480 Paroxysmal atrial fibrillation: Secondary | ICD-10-CM | POA: Diagnosis not present

## 2016-09-11 DIAGNOSIS — Z7189 Other specified counseling: Secondary | ICD-10-CM

## 2016-09-11 DIAGNOSIS — F419 Anxiety disorder, unspecified: Secondary | ICD-10-CM

## 2016-09-11 DIAGNOSIS — I159 Secondary hypertension, unspecified: Secondary | ICD-10-CM

## 2016-09-11 NOTE — Patient Instructions (Addendum)

## 2016-09-11 NOTE — Progress Notes (Signed)
Cardiology Office Note  Date:  09/11/2016   ID:  Norman Dickerson, DOB [birth year], MRN [id number]  PCP:  Marguarite Arbour, MD   Chief Complaint  Patient presents with  . other    12 month follow up. Meds reviewed by the pt. verbally. "doing well."     HPI:  82 yo with a history of paroxysmal atrial fibrillation, prior stroke at the end of 2013, previous unsuccessful attempt of a TEE  Unable to pass probe.   The patient was d/c'd home on Plavix and ASA following his stroke,   holter monitor showing atrial fibrillation, Changed to warfarin ,  He presents for routine followup of his atrial fibrillation 40-59% carotid disease on the right  In follow-up today, reports wife is having medical issues Presents today with his grandson Gait is stable, No falls, Labs in 06/2016 reviewed with him He was in the emergency room for shortness of breath, general malaise total chol 108 LDL 57  Reports having issues in the past with anxiety Symptoms better on Lexapro Denies having any shortness of breath, chest pain with exertion, or palpitations No lower extremity edema  EKG on today's visit shows normal sinus rhythm with rate 62 bpm, no significant ST or T-wave changes, old male   PMH:   has a past medical history of Chronic diastolic CHF (congestive heart failure) (HCC); Dysphagia; GERD (gastroesophageal reflux disease); Hematuria; Hyperlipidemia; Hypertension; PAF (paroxysmal atrial fibrillation) (HCC); and Stroke (HCC).  PSH:    Past Surgical History:  Procedure Laterality Date  . HIP SURGERY  2002  . SHOULDER SURGERY  2005  . TEE WITHOUT CARDIOVERSION  10/29/2012   Procedure: TRANSESOPHAGEAL ECHOCARDIOGRAM (TEE);  Surgeon: Pricilla Riffle, MD;  Location: [facility] ENDOSCOPY;  Service: Cardiovascular;  Laterality: N/A;    Current Outpatient Prescriptions  Medication Sig Dispense Refill  . INDOMETHACIN PO Take by mouth as needed.    Marland Kitchen omeprazole (PRILOSEC) 20 MG capsule Take 20 mg by mouth daily  as needed.     . simvastatin (ZOCOR) 10 MG tablet Take 1 tablet (10 mg total) by mouth daily. 90 tablet 3  . warfarin (COUMADIN) 5 MG tablet TAKE BY MOUTH AS DIRECTED BY COUMADIN CLINIC. 105 tablet 1  . escitalopram (LEXAPRO) 10 MG tablet Take 1 tablet (10 mg total) by mouth daily.     No current facility-administered medications for this visit.      Allergies:   Review of patient's allergies indicates no known allergies.   Social History:  The patient  reports that he quit smoking about 82 years ago. He has never used smokeless tobacco. He reports that he does not drink alcohol.   Family History:   Family history is unknown by patient.    Review of Systems: Review of Systems  Constitutional: Negative.   Respiratory: Negative.   Cardiovascular: Negative.   Gastrointestinal: Negative.   Musculoskeletal: Negative.        Gait instability  Neurological: Negative.   Psychiatric/Behavioral: Negative.   All other systems reviewed and are negative.    PHYSICAL EXAM: VS:  BP 130/82 (BP Location: Left Arm, Patient Position: Sitting, Cuff Size: Normal)   Pulse 62   Ht 5\' 6"  (1.676 m)   Wt 130 lb 8 oz (59.2 kg)   BMI 21.06 kg/m  , BMI Body mass index is 21.06 kg/m. GEN: Well nourished, well developed, in no acute distress  HEENT: normal  Neck: no JVD, carotid bruits, or masses Cardiac: RRR; no murmurs, rubs, or  gallops,no edema  Respiratory:  clear to auscultation bilaterally, normal work of breathing GI: soft, nontender, nondistended, + BS MS: no deformity or atrophy  Skin: warm and dry, no rash Neuro:  Strength and sensation are intact Psych: euthymic mood, full affect    Recent Labs: 06/24/2016: BUN 15; Creatinine, Ser 0.93; Hemoglobin 14.7; Platelets 178; Potassium 4.4; Sodium 136    Lipid Panel Lab Results  Component Value Date   CHOL 162 10/22/2012   HDL 47 10/22/2012   LDLCALC 105 (H) 10/22/2012   TRIG 51 10/22/2012      Wt Readings from Last 3 Encounters:   09/11/16 130 lb 8 oz (59.2 kg)  06/24/16 135 lb (61.2 kg)  08/04/15 135 lb (61.2 kg)       ASSESSMENT AND PLAN:  Stenosis of right internal carotid artery Previous ultrasound showing 40-59% disease on the right  Chronic diastolic CHF (congestive heart failure) (HCC) - Plan: EKG 12-Lead Appears relatively euvolemic on today's visit No changes made to his medications  Secondary hypertension - Plan: EKG 12-Lead Blood pressure is well controlled on today's visit. No changes made to the medications.  PAF (paroxysmal atrial fibrillation) (HCC) - Plan: EKG 12-Lead Maintaining normal sinus rhythm, tolerating anticoagulation Currently not on any rate control medication  Pure hypercholesterolemia Cholesterol is at goal on the current lipid regimen. No changes to the medications were made.  Encounter for anticoagulation discussion and counseling No recent falls, does not need a cane or walker Tolerating warfarin  Anxiety We discussed his recent ER evaluation July 2017 Feels better on Lexapro   Total encounter time more than 25 minutes  Greater than 50% was spent in counseling and coordination of care with the patient   Disposition:   F/U  12 months   Orders Placed This Encounter  Procedures  . EKG 12-Lead     Signed, Dossie Arbourim Gollan, M.D., Ph.D. 09/11/2016  [facility] [place] [phone]

## 2016-09-18 ENCOUNTER — Ambulatory Visit (INDEPENDENT_AMBULATORY_CARE_PROVIDER_SITE_OTHER): Payer: Medicare Other

## 2016-09-18 DIAGNOSIS — I639 Cerebral infarction, unspecified: Secondary | ICD-10-CM

## 2016-09-18 DIAGNOSIS — Z7189 Other specified counseling: Secondary | ICD-10-CM | POA: Diagnosis not present

## 2016-09-18 DIAGNOSIS — I4891 Unspecified atrial fibrillation: Secondary | ICD-10-CM | POA: Diagnosis not present

## 2016-09-18 DIAGNOSIS — Z7901 Long term (current) use of anticoagulants: Secondary | ICD-10-CM | POA: Diagnosis not present

## 2016-09-18 LAB — POCT INR: INR: 2.5

## 2016-10-15 ENCOUNTER — Emergency Department: Payer: Medicare Other

## 2016-10-15 ENCOUNTER — Emergency Department
Admission: EM | Admit: 2016-10-15 | Discharge: 2016-10-15 | Disposition: A | Discharge status: Home / Self Care | Payer: Medicare Other | Attending: Emergency Medicine | Admitting: Emergency Medicine

## 2016-10-15 ENCOUNTER — Encounter: Payer: Self-pay | Admitting: Emergency Medicine

## 2016-10-15 DIAGNOSIS — S79911A Unspecified injury of right hip, initial encounter: Secondary | ICD-10-CM | POA: Diagnosis present

## 2016-10-15 DIAGNOSIS — Y9389 Activity, other specified: Secondary | ICD-10-CM | POA: Diagnosis not present

## 2016-10-15 DIAGNOSIS — Z791 Long term (current) use of non-steroidal anti-inflammatories (NSAID): Secondary | ICD-10-CM | POA: Insufficient documentation

## 2016-10-15 DIAGNOSIS — Z87891 Personal history of nicotine dependence: Secondary | ICD-10-CM | POA: Diagnosis not present

## 2016-10-15 DIAGNOSIS — Y929 Unspecified place or not applicable: Secondary | ICD-10-CM | POA: Insufficient documentation

## 2016-10-15 DIAGNOSIS — W1839XA Other fall on same level, initial encounter: Secondary | ICD-10-CM | POA: Diagnosis not present

## 2016-10-15 DIAGNOSIS — Z7901 Long term (current) use of anticoagulants: Secondary | ICD-10-CM | POA: Insufficient documentation

## 2016-10-15 DIAGNOSIS — Y999 Unspecified external cause status: Secondary | ICD-10-CM | POA: Insufficient documentation

## 2016-10-15 DIAGNOSIS — I5032 Chronic diastolic (congestive) heart failure: Secondary | ICD-10-CM | POA: Diagnosis not present

## 2016-10-15 DIAGNOSIS — I110 Hypertensive heart disease with heart failure: Secondary | ICD-10-CM | POA: Diagnosis not present

## 2016-10-15 DIAGNOSIS — W19XXXA Unspecified fall, initial encounter: Secondary | ICD-10-CM

## 2016-10-15 DIAGNOSIS — S7001XA Contusion of right hip, initial encounter: Secondary | ICD-10-CM

## 2016-10-15 LAB — CBC WITH DIFFERENTIAL/PLATELET
BASOS PCT: 1 %
Basophils Absolute: 0.1 10*3/uL (ref 0–0.1)
EOS PCT: 4 %
Eosinophils Absolute: 0.3 10*3/uL (ref 0–0.7)
HEMATOCRIT: 41.3 % (ref 40.0–52.0)
Hemoglobin: 14.4 g/dL (ref 13.0–18.0)
LYMPHS PCT: 9 %
Lymphs Abs: 0.6 10*3/uL — ABNORMAL LOW (ref 1.0–3.6)
MCH: 31.2 pg (ref 26.0–34.0)
MCHC: 34.8 g/dL (ref 32.0–36.0)
MCV: 89.7 fL (ref 80.0–100.0)
MONO ABS: 0.4 10*3/uL (ref 0.2–1.0)
MONOS PCT: 5 %
NEUTROS ABS: 5.4 10*3/uL (ref 1.4–6.5)
Neutrophils Relative %: 81 %
PLATELETS: 162 10*3/uL (ref 150–440)
RBC: 4.6 MIL/uL (ref 4.40–5.90)
RDW: 13.4 % (ref 11.5–14.5)
WBC: 6.7 10*3/uL (ref 3.8–10.6)

## 2016-10-15 LAB — PROTIME-INR
INR: 2.06
Prothrombin Time: 23.5 seconds — ABNORMAL HIGH (ref 11.4–15.2)

## 2016-10-15 LAB — BASIC METABOLIC PANEL
ANION GAP: 6 (ref 5–15)
BUN: 14 mg/dL (ref 6–20)
CALCIUM: 8.9 mg/dL (ref 8.9–10.3)
CO2: 31 mmol/L (ref 22–32)
Chloride: 100 mmol/L — ABNORMAL LOW (ref 101–111)
Creatinine, Ser: 0.9 mg/dL (ref 0.61–1.24)
GFR calc Af Amer: >60 mL/min (ref >60)
GFR calc non Af Amer: >60 mL/min (ref >60)
GLUCOSE: 106 mg/dL — AB (ref 65–99)
Potassium: 4 mmol/L (ref 3.5–5.1)
Sodium: 137 mmol/L (ref 135–145)

## 2016-10-15 LAB — TYPE AND SCREEN
ABO/RH(D): A POS
Antibody Screen: NEGATIVE

## 2016-10-15 NOTE — ED Notes (Signed)
Pt discharged home after verbalizing understanding of discharge instructions; nad noted. 

## 2016-10-15 NOTE — ED Provider Notes (Signed)
[facility] Emergency Department Provider Note   ____________________________________________    I have reviewed the triage vital signs and the nursing notes.   HISTORY  Chief Complaint Fall     HPI Norman Dickerson is a 82 y.o. male who presents with her fall. Patient reports he was trying to put on his jeans and lost his balance and fell onto his right hip. He reports he has had his right hip replaced in the past by Dr. Ernest PineHooten. He denies other injuries. No head injury, no loss of consciousness. No neck pain. No back pain. No abdominal pain. He reports pain with extension of his right hip. Patient is on blood thinners but denies head injury   Past Medical History:  Diagnosis Date  . Chronic diastolic CHF (congestive heart failure) (HCC)    a. echo 10/2012: EF 65-70%, mildly dilated RV, PASP 37 mmHg  . Dysphagia   . GERD (gastroesophageal reflux disease)   . Hematuria   . Hyperlipidemia   . Hypertension   . PAF (paroxysmal atrial fibrillation) (HCC)    a. on warfarin; b. CHADS2VASc => 5 (HTN, age x 2, stroke x 2)  . Stroke [facility](HCC)    a. 10/2012    Patient Active Problem List   Diagnosis Date Noted  . PAF (paroxysmal atrial fibrillation) (HCC)   . Chronic diastolic CHF (congestive heart failure) (HCC)   . Encounter for anticoagulation discussion and counseling 12/28/2012  . Stroke, left 10/22/2012  . Dysphagia 10/22/2012  . Hyperlipidemia 10/22/2012  . Hematuria 10/22/2012  . possible Pulmonary nodule 10/22/2012  . Hypertension 10/22/2012  . Internal carotid artery stenosis, right 40-59% 10/22/2012    Past Surgical History:  Procedure Laterality Date  . HIP SURGERY  2002  . SHOULDER SURGERY  2005  . TEE WITHOUT CARDIOVERSION  10/29/2012   Procedure: TRANSESOPHAGEAL ECHOCARDIOGRAM (TEE);  Surgeon: Pricilla RifflePaula V Ross, MD;  [facility];  Service: Cardiovascular;  Laterality: N/A;    Prior to Admission medications   Medication Sig Start  Date End Date Taking? Authorizing Provider  escitalopram (LEXAPRO) 10 MG tablet Take 1 tablet (10 mg total) by mouth daily. 09/11/16   Antonieta Ibaimothy J Gollan, MD  INDOMETHACIN PO Take by mouth as needed.    Historical Provider, MD  omeprazole (PRILOSEC) 20 MG capsule Take 20 mg by mouth daily as needed.     Historical Provider, MD  simvastatin (ZOCOR) 10 MG tablet Take 1 tablet (10 mg total) by mouth daily. 03/19/16   Antonieta Ibaimothy J Gollan, MD  warfarin (COUMADIN) 5 MG tablet TAKE BY MOUTH AS DIRECTED BY COUMADIN CLINIC. 08/05/16   Antonieta Ibaimothy J Gollan, MD     Allergies Patient has no known allergies.  Family History  Problem Relation Age of Onset  . Family history unknown: Yes    Social History Social History  Substance Use Topics  . Smoking status: Former Smoker    Quit date: 12/21/1933  . Smokeless tobacco: Never Used  . Alcohol use No    Review of Systems  Constitutional: No Dizziness Eyes: No visual changes.  ENT: No Neck pain Cardiovascular: Denies chest pain. Respiratory: Denies shortness of breath. Gastrointestinal: No abdominal pain.  Genitourinary: Negative for dysuria. Musculoskeletal: Hip pain as above Skin: Negative for abrasion or laceration Neurological: Negative for headaches  10-point ROS otherwise negative.  ____________________________________________   PHYSICAL EXAM:  VITAL SIGNS: ED Triage Vitals  Enc Vitals Group     BP 10/15/16 0823 (!) 182/92  Pulse Rate 10/15/16 0823 68     Resp 10/15/16 0823 (!) 185     Temp 10/15/16 0823 98.2 F (36.8 C)     Temp Source 10/15/16 0823 Oral     SpO2 10/15/16 0823 96 %     Weight 10/15/16 0824 130 lb (59 kg)     Height 10/15/16 0824 5\' 5"  (1.651 m)     Head Circumference --      Peak Flow --      Pain Score --      Pain Loc --      Pain Edu? --      Excl. in GC? --     Constitutional: Alert and oriented. No acute distress. Pleasant and interactive Eyes: Conjunctivae are normal.  Head: Atraumatic. Nose: No  congestion/rhinnorhea. Mouth/Throat: Mucous membranes are moist.   Neck:  Painless ROM, No vertebral tenderness to palpation Cardiovascular:  Good peripheral circulation. Respiratory: Normal respiratory effort.  No retractions. Lungs CTAB. Gastrointestinal: Soft and nontender. No distention.  No CVA tenderness. Genitourinary: deferred Musculoskeletal: Pain with extension of right hip at approximately 30, no pain with anterior or exterior rotation are 2+ distal pulses. No bony abnormalities felt. No vertebral tenderness to palpation. No pain with axial load Neurologic:  Normal speech and language. No gross focal neurologic deficits are appreciated. Cranial nerves II-12 are normal Skin:  Skin is warm, dry and intact. No rash noted. Psychiatric: Mood and affect are normal. Speech and behavior are normal.  ____________________________________________   LABS (all labs ordered are listed, but only abnormal results are displayed)  Labs Reviewed  BASIC METABOLIC PANEL  CBC WITH DIFFERENTIAL/PLATELET  PROTIME-INR  TYPE AND SCREEN   ____________________________________________  EKG  None ____________________________________________  RADIOLOGY x-ray normal ____________________________________________   PROCEDURES  Procedure(s) performed: No    Critical Care performed: No ____________________________________________   INITIAL IMPRESSION / ASSESSMENT AND PLAN / ED COURSE  Pertinent labs & imaging results that were available during my care of the patient were reviewed by me and considered in my medical decision making (see chart for details).  Patient with fall onto right hip, previous replacement, x-rays pending.  Clinical Course   Patient's x-ray does not history any fracture. He remains well-appearing with no neurological changes. I helped the patient stand up and he was able to walk, he was able to bear his weight on his right leg, his son reports he is at his baseline  with regards to ambulation.  Feel patient is appropriate for discharge at this time, return precautions discussed ____________________________________________   FINAL CLINICAL IMPRESSION(S) / ED DIAGNOSES  Final diagnoses:  Fall, initial encounter  Contusion of right hip, initial encounter      NEW MEDICATIONS STARTED DURING THIS VISIT:  New Prescriptions   No medications on file     Note:  This document was prepared using Dragon voice recognition software and may include unintentional dictation errors.    Jene Everyobert Encarnacion Bole, MD 10/15/16 [phone]

## 2016-10-15 NOTE — ED Triage Notes (Signed)
Pt presents from home after falling while dressing this morning. Pt states he fell on his right hip, which he had replaced in approximately 2001. Pt did not try to get up as he was concerned about his hip. Pt c/o pain in his right hip that occurs when he tries to move it. Pain locale is glute on right side. NAD noted.

## 2016-10-16 ENCOUNTER — Telehealth: Payer: Self-pay | Admitting: Cardiovascular Disease

## 2016-10-16 ENCOUNTER — Ambulatory Visit (INDEPENDENT_AMBULATORY_CARE_PROVIDER_SITE_OTHER): Payer: Medicare Other

## 2016-10-16 DIAGNOSIS — Z7189 Other specified counseling: Secondary | ICD-10-CM

## 2016-10-16 NOTE — Telephone Encounter (Signed)
Pt calling stating he had a fall yesterday and he ended up in hospital  They didn't find anything wrong But he is asking about his coumadin Would like to know if he is to take the same dose like normal

## 2016-10-16 NOTE — Telephone Encounter (Signed)
Returned call to pt, see anticoagulation note in Epic. 

## 2016-10-23 ENCOUNTER — Emergency Department (HOSPITAL_COMMUNITY): Payer: Medicare Other

## 2016-10-23 ENCOUNTER — Inpatient Hospital Stay (HOSPITAL_COMMUNITY)
Admission: EM | Admit: 2016-10-23 | Discharge: 2016-11-08 | DRG: 064 | Disposition: E | Payer: Medicare Other | Attending: Neurology | Admitting: Neurology

## 2016-10-23 DIAGNOSIS — Z66 Do not resuscitate: Secondary | ICD-10-CM | POA: Diagnosis present

## 2016-10-23 DIAGNOSIS — Z7901 Long term (current) use of anticoagulants: Secondary | ICD-10-CM | POA: Diagnosis not present

## 2016-10-23 DIAGNOSIS — I610 Nontraumatic intracerebral hemorrhage in hemisphere, subcortical: Secondary | ICD-10-CM | POA: Diagnosis present

## 2016-10-23 DIAGNOSIS — Z79899 Other long term (current) drug therapy: Secondary | ICD-10-CM

## 2016-10-23 DIAGNOSIS — I619 Nontraumatic intracerebral hemorrhage, unspecified: Secondary | ICD-10-CM | POA: Diagnosis present

## 2016-10-23 DIAGNOSIS — E785 Hyperlipidemia, unspecified: Secondary | ICD-10-CM | POA: Diagnosis present

## 2016-10-23 DIAGNOSIS — G9340 Encephalopathy, unspecified: Secondary | ICD-10-CM

## 2016-10-23 DIAGNOSIS — Z791 Long term (current) use of non-steroidal anti-inflammatories (NSAID): Secondary | ICD-10-CM | POA: Diagnosis not present

## 2016-10-23 DIAGNOSIS — I5032 Chronic diastolic (congestive) heart failure: Secondary | ICD-10-CM | POA: Diagnosis present

## 2016-10-23 DIAGNOSIS — R29722 NIHSS score 22: Secondary | ICD-10-CM | POA: Diagnosis present

## 2016-10-23 DIAGNOSIS — G8191 Hemiplegia, unspecified affecting right dominant side: Secondary | ICD-10-CM

## 2016-10-23 DIAGNOSIS — K219 Gastro-esophageal reflux disease without esophagitis: Secondary | ICD-10-CM | POA: Diagnosis present

## 2016-10-23 DIAGNOSIS — I629 Nontraumatic intracranial hemorrhage, unspecified: Secondary | ICD-10-CM

## 2016-10-23 DIAGNOSIS — I480 Paroxysmal atrial fibrillation: Secondary | ICD-10-CM | POA: Diagnosis present

## 2016-10-23 DIAGNOSIS — I615 Nontraumatic intracerebral hemorrhage, intraventricular: Secondary | ICD-10-CM | POA: Diagnosis present

## 2016-10-23 DIAGNOSIS — Z515 Encounter for palliative care: Secondary | ICD-10-CM | POA: Diagnosis not present

## 2016-10-23 DIAGNOSIS — I110 Hypertensive heart disease with heart failure: Secondary | ICD-10-CM | POA: Diagnosis present

## 2016-10-23 DIAGNOSIS — Z8673 Personal history of transient ischemic attack (TIA), and cerebral infarction without residual deficits: Secondary | ICD-10-CM | POA: Diagnosis not present

## 2016-10-23 LAB — CBC
HCT: 39.4 % (ref 39.0–52.0)
Hemoglobin: 13.3 g/dL (ref 13.0–17.0)
MCH: 30.5 pg (ref 26.0–34.0)
MCHC: 33.8 g/dL (ref 30.0–36.0)
MCV: 90.4 fL (ref 78.0–100.0)
PLATELETS: 196 10*3/uL (ref 150–400)
RBC: 4.36 MIL/uL (ref 4.22–5.81)
RDW: 13 % (ref 11.5–15.5)
WBC: 7.7 10*3/uL (ref 4.0–10.5)

## 2016-10-23 LAB — I-STAT CHEM 8, ED
BUN: 16 mg/dL (ref 6–20)
CHLORIDE: 99 mmol/L — AB (ref 101–111)
Calcium, Ion: 1.12 mmol/L — ABNORMAL LOW (ref 1.15–1.40)
Creatinine, Ser: 0.9 mg/dL (ref 0.61–1.24)
Glucose, Bld: 141 mg/dL — ABNORMAL HIGH (ref 65–99)
HEMATOCRIT: 39 % (ref 39.0–52.0)
Hemoglobin: 13.3 g/dL (ref 13.0–17.0)
POTASSIUM: 3.7 mmol/L (ref 3.5–5.1)
SODIUM: 137 mmol/L (ref 135–145)
TCO2: 25 mmol/L (ref 0–100)

## 2016-10-23 LAB — DIFFERENTIAL
BASOS PCT: 1 %
Basophils Absolute: 0 10*3/uL (ref 0.0–0.1)
EOS PCT: 3 %
Eosinophils Absolute: 0.3 10*3/uL (ref 0.0–0.7)
LYMPHS PCT: 21 %
Lymphs Abs: 1.6 10*3/uL (ref 0.7–4.0)
Monocytes Absolute: 0.7 10*3/uL (ref 0.1–1.0)
Monocytes Relative: 9 %
NEUTROS PCT: 66 %
Neutro Abs: 5.2 10*3/uL (ref 1.7–7.7)

## 2016-10-23 LAB — TRIGLYCERIDES: TRIGLYCERIDES: 74 mg/dL (ref <150)

## 2016-10-23 LAB — COMPREHENSIVE METABOLIC PANEL
ALBUMIN: 3.6 g/dL (ref 3.5–5.0)
ALK PHOS: 45 U/L (ref 38–126)
ALT: 12 U/L — ABNORMAL LOW (ref 17–63)
ANION GAP: 10 (ref 5–15)
AST: 25 U/L (ref 15–41)
BUN: 15 mg/dL (ref 6–20)
CHLORIDE: 101 mmol/L (ref 101–111)
CO2: 24 mmol/L (ref 22–32)
Calcium: 8.9 mg/dL (ref 8.9–10.3)
Creatinine, Ser: 0.92 mg/dL (ref 0.61–1.24)
GFR calc non Af Amer: >60 mL/min (ref >60)
GLUCOSE: 141 mg/dL — AB (ref 65–99)
POTASSIUM: 3.7 mmol/L (ref 3.5–5.1)
SODIUM: 135 mmol/L (ref 135–145)
Total Bilirubin: 0.6 mg/dL (ref 0.3–1.2)
Total Protein: 6.7 g/dL (ref 6.5–8.1)

## 2016-10-23 LAB — PROTIME-INR
INR: 2.29
PROTHROMBIN TIME: 25.7 s — AB (ref 11.4–15.2)

## 2016-10-23 LAB — I-STAT TROPONIN, ED: Troponin i, poc: 0 ng/mL (ref 0.00–0.08)

## 2016-10-23 LAB — APTT: aPTT: 36 seconds (ref 24–36)

## 2016-10-23 LAB — MRSA PCR SCREENING: MRSA by PCR: NEGATIVE

## 2016-10-23 MED ORDER — LORAZEPAM 2 MG/ML IJ SOLN
5.0000 mg/h | INTRAVENOUS | Status: DC
  Administered 2016-10-24 (×2): 5 mg/h via INTRAVENOUS
  Filled 2016-10-23 (×2): qty 25

## 2016-10-23 MED ORDER — PROPOFOL 1000 MG/100ML IV EMUL
5.0000 ug/kg/min | INTRAVENOUS | Status: DC
  Administered 2016-10-23: 5 ug/kg/min via INTRAVENOUS
  Administered 2016-10-23: 2 mL via INTRAVENOUS

## 2016-10-23 MED ORDER — STROKE: EARLY STAGES OF RECOVERY BOOK
Freq: Once | Status: AC
  Administered 2016-10-23: 21:00:00
  Filled 2016-10-23: qty 1

## 2016-10-23 MED ORDER — ACETAMINOPHEN 650 MG RE SUPP: 650.0000 mg | RECTAL | Status: DC | PRN

## 2016-10-23 MED ORDER — SODIUM CHLORIDE 0.9 % IV SOLN
10.0000 mg/h | INTRAVENOUS | Status: DC
  Administered 2016-10-24: 10 mg/h via INTRAVENOUS
  Filled 2016-10-23: qty 10

## 2016-10-23 MED ORDER — NICARDIPINE HCL IN NACL 20-0.86 MG/200ML-% IV SOLN
3.0000 mg/h | INTRAVENOUS | Status: DC
  Administered 2016-10-23: 5 mg/h via INTRAVENOUS
  Filled 2016-10-23: qty 200

## 2016-10-23 MED ORDER — SENNOSIDES-DOCUSATE SODIUM 8.6-50 MG PO TABS: 1.0000 | ORAL_TABLET | Freq: Two times a day (BID) | ORAL | Status: DC

## 2016-10-23 MED ORDER — NICARDIPINE HCL IN NACL 20-0.86 MG/200ML-% IV SOLN
0.0000 mg/h | INTRAVENOUS | Status: DC
  Administered 2016-10-23: 5 mg/h via INTRAVENOUS
  Filled 2016-10-23: qty 200

## 2016-10-23 MED ORDER — MORPHINE BOLUS VIA INFUSION
5.0000 mg | INTRAVENOUS | Status: DC | PRN
  Filled 2016-10-23: qty 20

## 2016-10-23 MED ORDER — ONDANSETRON HCL 4 MG/2ML IJ SOLN
4.0000 mg | Freq: Once | INTRAMUSCULAR | Status: AC
  Administered 2016-10-23: 4 mg via INTRAVENOUS

## 2016-10-23 MED ORDER — CHLORHEXIDINE GLUCONATE 0.12% ORAL RINSE (MEDLINE KIT)
15.0000 mL | Freq: Two times a day (BID) | OROMUCOSAL | Status: DC
  Administered 2016-10-23: 15 mL via OROMUCOSAL

## 2016-10-23 MED ORDER — PROPOFOL 1000 MG/100ML IV EMUL
INTRAVENOUS | Status: AC
Start: 2016-10-23 — End: 2016-10-23
  Administered 2016-10-23: 5 ug/kg/min via INTRAVENOUS
  Filled 2016-10-23: qty 100

## 2016-10-23 MED ORDER — PANTOPRAZOLE SODIUM 40 MG IV SOLR
40.0000 mg | Freq: Every day | INTRAVENOUS | Status: DC
  Administered 2016-10-23: 40 mg via INTRAVENOUS
  Filled 2016-10-23: qty 40

## 2016-10-23 MED ORDER — SUCCINYLCHOLINE CHLORIDE 20 MG/ML IJ SOLN
INTRAMUSCULAR | Status: AC | PRN
  Administered 2016-10-23: 90 mg via INTRAVENOUS

## 2016-10-23 MED ORDER — ACETAMINOPHEN 325 MG PO TABS: 650.0000 mg | ORAL_TABLET | ORAL | Status: DC | PRN

## 2016-10-23 MED ORDER — SODIUM CHLORIDE 0.9 % IV SOLN
INTRAVENOUS | Status: AC | PRN
  Administered 2016-10-23: 125 mL/h via INTRAVENOUS

## 2016-10-23 MED ORDER — ORAL CARE MOUTH RINSE
15.0000 mL | OROMUCOSAL | Status: DC
  Administered 2016-10-23 – 2016-10-24 (×3): 15 mL via OROMUCOSAL

## 2016-10-23 MED ORDER — LIDOCAINE HCL 2 % EX GEL
1.0000 "application " | Freq: Once | CUTANEOUS | Status: DC
  Filled 2016-10-23: qty 5

## 2016-10-23 MED ORDER — ETOMIDATE 2 MG/ML IV SOLN
INTRAVENOUS | Status: AC | PRN
  Administered 2016-10-23: 18 mg via INTRAVENOUS

## 2016-10-23 MED ORDER — LORAZEPAM BOLUS VIA INFUSION
2.0000 mg | INTRAVENOUS | Status: DC | PRN
Start: 2016-10-23 — End: 2016-10-24
  Filled 2016-10-23: qty 5

## 2016-10-23 NOTE — Progress Notes (Signed)
RT note- ETT placement at 26cm, noted with RN.

## 2016-10-23 NOTE — H&P (Signed)
H&P    Chief Complaint: ICH   HPI:                                                                                                                                         Norman Dickerson is an 82 y.o. male 82 year old male patient who is on Coumadin secondary to atrial fibrillation. Patient was at [employer] today 1 approximately 12:30 he is noticed to fall and hit his head on the copier. EMS was called. On arrival  EMS  assessed right sided weakness and slurred speech and activated a code stroke.  Patient on Coumadin  patient's blood pressure was systolically in the 867Y. While in route patient had multiple episodes of bradycardia. On arrival patient was nonresponsive flaccid on the right had a blood pressure of 210/109. Patient was intubated for airway protection and then brought to CT scan her. In the CT scan showed a significantly large left intraparenchymal with intraventricular extension all the way to the fourth ventricle. Patient currently is on Cardene drip for blood pressure management. Patient's family was met and the decision to maintain patient occurred status and go no further with aggressive treatment was made. At this time they're waiting for family members to arrive to make final decision. Family seems very reasonable. It was explained to them that the extent of this bleed is likely nothing that is sustainable.    Date last known well: Date: 10/20/2016 Time last known well: Time: 12:30 tPA Given: No: ich   Past Medical History:  Diagnosis Date  . Chronic diastolic CHF (congestive heart failure) ([place])    a. echo 10/2012: EF 65-70%, mildly dilated RV, PASP 37 mmHg  . Dysphagia   . GERD (gastroesophageal reflux disease)   . Hematuria   . Hyperlipidemia   . Hypertension   . PAF (paroxysmal atrial fibrillation) (HCC)    a. on warfarin; b. CHADS2VASc => 5 (HTN, age x 2, stroke x 2)  . Stroke [facility])    a. 10/2012    Past Surgical History:  Procedure Laterality Date  .  HIP SURGERY  2002  . SHOULDER SURGERY  2005  . TEE WITHOUT CARDIOVERSION  10/29/2012   Procedure: TRANSESOPHAGEAL ECHOCARDIOGRAM (TEE);  Surgeon: Fay Records, MD;  Location: [facility] ENDOSCOPY;  Service: Cardiovascular;  Laterality: N/A;    Family History  Problem Relation Age of Onset  . Family history unknown: Yes   Social History:  reports that he quit smoking about 82 years ago. He has never used smokeless tobacco. He reports that he does not drink alcohol. His drug history is not on file.  Allergies: No Known Allergies  Medications:  Current Facility-Administered Medications  Medication Dose Route Frequency Provider Last Rate Last Dose  . nicardipine (CARDENE) 56m in 0.86% saline 2060mIV infusion (0.1 mg/ml)  3-15 mg/hr Intravenous Continuous DaJulianne RiceMD 100 mL/hr at 11/03/2016 1417 10 mg/hr at 11/05/2016 1417  . propofol (DIPRIVAN) 1000 MG/100ML infusion  5-80 mcg/kg/min Intravenous Titrated DaJulianne RiceMD 7.1 mL/hr at 10/29/2016 1407 20 mcg/kg/min at 10/21/2016 1407   Current Outpatient Prescriptions  Medication Sig Dispense Refill  . escitalopram (LEXAPRO) 10 MG tablet Take 1 tablet (10 mg total) by mouth daily.    . INDOMETHACIN PO Take by mouth as needed.    . meloxicam (MOBIC) 7.5 MG tablet Take 1 tablet by mouth daily.    . Marland Kitchenmeprazole (PRILOSEC) 20 MG capsule Take 20 mg by mouth daily as needed.     . simvastatin (ZOCOR) 10 MG tablet Take 1 tablet (10 mg total) by mouth daily. 90 tablet 3  . warfarin (COUMADIN) 5 MG tablet TAKE BY MOUTH AS DIRECTED BY COUMADIN CLINIC. 105 tablet 1     ROS:                                                                                                                                       History obtained from unobtainable from patient due to mental status   Neurologic Examination:                                                                                                       Blood pressure 164/74, pulse 71, temperature (!) 96.8 F (36 C), temperature source Axillary, resp. rate 16, SpO2 100 %.  HEENT-  Normocephalic, no lesions, without obvious abnormality.  Normal external eye and conjunctiva.  Normal TM's bilaterally.  Normal auditory canals and external ears. Normal external nose, mucus membranes and septum.  Normal pharynx. Cardiovascular- irregularly irregular rhythm, pulses palpable throughout   Lungs- chest clear, no wheezing, rales, normal symmetric air entry, Heart exam - S1, S2 normal, no murmur, no gallop, rate regular Abdomen- soft, non-tender; bowel sounds normal; no masses,  no organomegaly Extremities- no edema Lymph-no adenopathy palpable Musculoskeletal-no joint tenderness, deformity or swelling Skin-warm and dry, no hyperpigmentation, vitiligo, or suspicious lesions  Neurological Examination Mental Status: Patient does not respond to verbal stimuli.  Does not respond to deep sternal rub.  Does not follow commands.  No verbalizations noted.  Cranial Nerves: II: patient does not respond confrontation bilaterally, pupils right 2 mm, left 3 mm,and Nonreactive bilaterally  III,IV,VI: doll's response absent bilaterally.  V,VII: corneal reflex absent bilaterally  VIII: patient does not respond to verbal stimuli IX,X: gag reflex present, XI: trapezius strength unable to test bilaterally XII: tongue strength unable to test Motor: Extremities flaccid throughout the right side.  Minimal tone on the left side with no purposeful movements Sensory: Does not respond to noxious stimuli in any extremity. Deep Tendon Reflexes:  2+ throughout upper extremities and lower extremities without ankle jerk Plantars: upgoing bilaterally Cerebellar: Unable to perform         Lab Results: Basic Metabolic Panel:  Recent Labs Lab 10/30/2016 1331 10/17/2016 1336  NA 135 137  K 3.7 3.7  CL 101  99*  CO2 24  --   GLUCOSE 141* 141*  BUN 15 16  CREATININE 0.92 0.90  CALCIUM 8.9  --     Liver Function Tests:  Recent Labs Lab 10/13/2016 1331  AST 25  ALT 12*  ALKPHOS 45  BILITOT 0.6  PROT 6.7  ALBUMIN 3.6   No results for input(s): LIPASE, AMYLASE in the last 168 hours. No results for input(s): AMMONIA in the last 168 hours.  CBC:  Recent Labs Lab 10/30/2016 1331 10/16/2016 1336  WBC 7.7  --   NEUTROABS 5.2  --   HGB 13.3 13.3  HCT 39.4 39.0  MCV 90.4  --   PLT 196  --     Cardiac Enzymes: No results for input(s): CKTOTAL, CKMB, CKMBINDEX, TROPONINI in the last 168 hours.  Lipid Panel: No results for input(s): CHOL, TRIG, HDL, CHOLHDL, VLDL, LDLCALC in the last 168 hours.  CBG: No results for input(s): GLUCAP in the last 168 hours.  Microbiology: Results for orders placed or performed in visit on 11/01/12  Culture, blood (single)     Status: None   Collection Time: 11/01/12  3:42 PM  Result Value Ref Range Status   Micro Text Report   Final       COMMENT                   NO GROWTH AEROBICALLY/ANAEROBICALLY IN 5 DAYS   ANTIBIOTIC                                                      Culture, blood (single)     Status: None   Collection Time: 11/01/12  3:42 PM  Result Value Ref Range Status   Micro Text Report   Final       COMMENT                   NO GROWTH AEROBICALLY/ANAEROBICALLY IN 5 DAYS   ANTIBIOTIC                                                        Coagulation Studies:  Recent Labs  10/10/2016 1331  LABPROT 25.7*  INR 2.29    Imaging: Dg Chest Portable 1 View  Result Date: 11/01/2016 CLINICAL DATA:  Code stroke.  Intubated. EXAM: PORTABLE CHEST 1 VIEW COMPARISON:  06/24/2016 FINDINGS: Endotracheal tube tip is 3 cm above the carina. Heart size is normal. There is aortic atherosclerosis. There is  mild volume loss in both lower lobes. Patchy density in the right middle lung seen as noted previously. No acute bone finding.  IMPRESSION: Endotracheal tube well positioned. Mild basilar volume loss. Patchy density right mid lung as seen previously. Electronically Signed   By: Nelson Chimes M.D.   On: 10/15/2016 14:14   Ct Head Code Stroke W/o Cm  Result Date: 10/13/2016 CLINICAL DATA:  Code stroke. Altered mental status and unresponsive. Right-sided weakness. Hypertension. Fall. EXAM: CT HEAD WITHOUT CONTRAST TECHNIQUE: Contiguous axial images were obtained from the base of the skull through the vertex without intravenous contrast. COMPARISON:  08/04/2015 FINDINGS: Brain: Large parenchymal hematoma in the left cerebral hemisphere centered in the left basal ganglia. Hematoma measures approximately 5.5 x 9 cm in diameter. High-density hemorrhage is present. There is extensive intraventricular extension with blood filling the lateral, third and fourth ventricles. No hydrocephalus. Small amount of subarachnoid hemorrhage on the left. No subdural hematoma. 15 mm midline shift to the right. Extensive atrophy. Chronic microvascular ischemic changes. Periventricular white matter hypodensity has increased from the prior study which may be due to transependymal absorption of CSF versus progressive microvascular ischemia. Vascular: Diffuse arterial calcification.  Negative for dense MCA. Skull: Negative Sinuses/Orbits: Mucous retention cyst on the left. Complete opacification left frontal sinus. Normal orbit. Other: None IMPRESSION: 1. Large left hemispheric hematoma centered in the basal ganglia. Small amount of subarachnoid hemorrhage. Extensive intraventricular hemorrhage. 15 mm midline shift to the right. Favor hypertensive hemorrhage. Cerebral amyloid also a consideration. The patient is anticoagulated 2. Critical Value/emergent results were called by telephone at the time of interpretation on 10/21/2016 at 2:18 pm to Dr. Shon Hale , who verbally acknowledged these results. Electronically Signed   By: Franchot Gallo M.D.   On: 10/22/2016 14:19        Assessment and plan discussed with with attending physician and they are in agreement.    Etta Quill PA-C [facility] [phone]  10/25/2016, 2:29 PM   Assessment: 83 y.o. male presenting to the emergency room as a code stroke. CT findings are consistent with extensive intraparenchymal and intraventricular hemorrhage which includes both lateral ventricles, third ventricle and fourth ventricle. Patient currently is intubated sedated, on Cardene drip for blood pressure control. Patient is a DO NOT RESUSCITATE. Discussion with family has been made. Patient be transferred to ICU with no escalation of care awaiting family members to arrive. At that time family decision about withdrawal of care.  Stroke Risk Factors - atrial fibrillation and hyperlipidemia

## 2016-10-23 NOTE — Code Documentation (Addendum)
82[facility] at 1329.  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  EMS was called and assessed right sided weakness and slurred speech and activated a code stroke.  Patient on Coumadin.  Stroke team at the bedside on patient arrival.  Labs drawn and patient to Trauma B for airway management.  NIHSS 22, see documentation for details and code stroke times.  Patient nonverbal with right sided hemiplegia and unable to move left leg on exam.  Patient able to raise LUE without difficulty.  Patient intubated by EDP.  Patient to CT with team.  CT showing large left hemispheric hematoma centered in the basal ganglia.  Small amount of subarachnoid hemorrhage. Extensive intraventricular hemorrhage. 15 mm midline shift to the right.  Patient hypertensive on arrival.  Patient remained hypertensive despite RSI medications and Propofol gtt.  Cardene gtt started and titrated to keep SBP<160.  Patient to be admitted to ICU.  Bedside handoff with ED RN Lowella BandyNikki.

## 2016-10-23 NOTE — Procedures (Signed)
Intubation Procedure Note Norman ChromanHugh A Dickerson [id number] 09/06/1920  Procedure: Intubation Indications: Airway protection and maintenance  Procedure Details Consent: Risks of procedure as well as the alternatives and risks of each were explained to the (patient/caregiver).  Consent for procedure obtained. Time Out: Verified patient identification, verified procedure, site/side was marked, verified correct patient position, special equipment/implants available, medications/allergies/relevent history reviewed, required imaging and test results available.  Performed  Maximum sterile technique was used including gloves.  MAC    Evaluation Hemodynamic Status: BP stable throughout; O2 sats: stable throughout Patient's Current Condition: stable Complications: No apparent complications Patient did tolerate procedure well. Chest X-ray ordered to verify placement.  CXR: pending.   Katheren ShamsPowell, Norman Dickerson Norman Dickerson 10/22/2016

## 2016-10-23 NOTE — Code Documentation (Signed)
Dr. Ranae Palmsyelverton at bedside preparing to intubate pt.

## 2016-10-23 NOTE — ED Notes (Signed)
Paged chaplain  

## 2016-10-23 NOTE — Progress Notes (Signed)
Worked with pt's family throughout afternoon. Another brother and friend arrived, and former is cont[place]. Prayed with son and grandson, provided emotional/spiritiual/grief support -- also pt placement information (with new phone number). Pt transported to [facility] for maintenance till [place]. Passed information on to night chaplain Casimiro NeedleMichael.   May 08, 2016 1700  Clinical Encounter Type  Visited With Family  Visit Type Follow-up;Psychological support;Spiritual support;Social support  Referral From Nurse  Spiritual Encounters  Spiritual Needs Prayer;Emotional  Stress Factors  Patient Stress Factors Health changes  Family Stress Factors Family relationships;Health changes;Loss

## 2016-10-23 NOTE — Procedures (Signed)
Intubation Procedure Note Norman Dickerson [id number] 07/17/1920  Procedure: Intubation Indications: Airway protection and maintenance  Procedure Details Consent: Risks of procedure as well as the alternatives and risks of each were explained to the (patient/caregiver).  Consent for procedure obtained. Time Out: Verified patient identification, verified procedure, site/side was marked, verified correct patient position, special equipment/implants available, medications/allergies/relevent history reviewed, required imaging and test results available.  Performed  Maximum sterile technique was used including gloves.  MAC    Evaluation Hemodynamic Status: BP stable throughout; O2 sats: stable throughout Patient's Current Condition: stable Complications: No apparent complications Patient did tolerate procedure well. Chest X-ray ordered to verify placement.  CXR: pending.   Norman Dickerson 10/16/2016   

## 2016-10-23 NOTE — ED Notes (Signed)
Neuro MD at bedside discussing care with family

## 2016-10-23 NOTE — Progress Notes (Signed)
Responded to ED page to meet pt's family in waiting rm. One of pt's sons and grandsons were there. Explained how doctors would come speak to family when they knew anything, he was going for tests now, and iwould be at least 20-30 minutes. 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 It was better than his wife dying for ten years of cancer.   10/30/2016 1400  Clinical Encounter Type  Visited With Family  Visit Type Initial  Referral From Nurse  Spiritual Encounters  Spiritual Needs Emotional  Stress Factors  Patient Stress Factors Health changes  Family Stress Factors Family relationships;Health changes;Loss of control

## 2016-10-24 DIAGNOSIS — I610 Nontraumatic intracerebral hemorrhage in hemisphere, subcortical: Secondary | ICD-10-CM

## 2016-10-24 MED ORDER — ORAL CARE MOUTH RINSE
15.0000 mL | OROMUCOSAL | Status: DC
  Administered 2016-10-24: 15 mL via OROMUCOSAL

## 2016-11-07 ENCOUNTER — Telehealth: Payer: Self-pay | Admitting: Cardiovascular Disease

## 2016-11-07 NOTE — Telephone Encounter (Signed)
[place] had a stroke and has passed away.  Sympathy card started for family.

## 2016-11-08 NOTE — ED Provider Notes (Signed)
[facility]-EMERGENCY DEPT Provider Note   CSN: [id number] Arrival date & time: 10/14/2016  1330     History   Chief Complaint Chief Complaint  Patient presents with  . Code Stroke    HPI Norman Dickerson is a 82 y.o. male.  HPI Patient arrives as a code stroke. He was making copies of [employer] when he had a witnessed fall at 1230. Unsure whether there was any head trauma. Patient found to have right-sided hemiparesis and slurred speech have decline in his mental status en route to the emergency department. Unable to contribute to history. Level V caveat applies. Recently started on Coumadin. Past Medical History:  Diagnosis Date  . Chronic diastolic CHF (congestive heart failure) (HCC)    a. echo 10/2012: EF 65-70%, mildly dilated RV, PASP 37 mmHg  . Dysphagia   . GERD (gastroesophageal reflux disease)   . Hematuria   . Hyperlipidemia   . Hypertension   . PAF (paroxysmal atrial fibrillation) (HCC)    a. on warfarin; b. CHADS2VASc => 5 (HTN, age x 2, stroke x 2)  . Stroke [facility])    a. 10/2012    Patient Active Problem List   Diagnosis Date Noted  . ICH (intracerebral hemorrhage) (HCC) 10/12/2016  . PAF (paroxysmal atrial fibrillation) (HCC)   . Chronic diastolic CHF (congestive heart failure) (HCC)   . Encounter for anticoagulation discussion and counseling 12/28/2012  . Stroke, left 10/22/2012  . Dysphagia 10/22/2012  . Hyperlipidemia 10/22/2012  . Hematuria 10/22/2012  . possible Pulmonary nodule 10/22/2012  . Hypertension 10/22/2012  . Internal carotid artery stenosis, right 40-59% 10/22/2012    Past Surgical History:  Procedure Laterality Date  . HIP SURGERY  2002  . SHOULDER SURGERY  2005  . TEE WITHOUT CARDIOVERSION  10/29/2012   Procedure: TRANSESOPHAGEAL ECHOCARDIOGRAM (TEE);  Surgeon: Pricilla Riffle, MD;  Location: [facility] ENDOSCOPY;  Service: Cardiovascular;  Laterality: N/A;       Home Medications    Prior to Admission medications   Medication Sig Start Date  End Date Taking? Authorizing Provider  escitalopram (LEXAPRO) 10 MG tablet Take 1 tablet (10 mg total) by mouth daily. 09/11/16  Yes Antonieta Iba, MD  meloxicam (MOBIC) 7.5 MG tablet Take 1 tablet by mouth daily. 10/10/16  Yes Historical Provider, MD  omeprazole (PRILOSEC) 20 MG capsule Take 20 mg by mouth daily as needed. For heartburn   Yes Historical Provider, MD  simvastatin (ZOCOR) 10 MG tablet Take 1 tablet (10 mg total) by mouth daily. 03/19/16  Yes Antonieta Iba, MD  warfarin (COUMADIN) 5 MG tablet TAKE BY MOUTH AS DIRECTED BY COUMADIN CLINIC. 08/05/16  Yes Antonieta Iba, MD    Family History Family History  Problem Relation Age of Onset  . Family history unknown: Yes    Social History Social History  Substance Use Topics  . Smoking status: Former Smoker    Quit date: 12/21/1933  . Smokeless tobacco: Never Used  . Alcohol use No     Allergies   Patient has no known allergies.   Review of Systems Review of Systems  Unable to perform ROS: Mental status change     Physical Exam Updated Vital Signs BP (!) 104/54 (BP Location: Right Arm)   Pulse 98   Temp 97.9 F (36.6 C) (Axillary)   Resp (!) 22   Wt 129 lb 13.6 oz (58.9 kg)   SpO2 91%   BMI 21.61 kg/m   Physical Exam  Constitutional: He appears  well-developed and well-nourished.  HENT:  Head: Normocephalic and atraumatic.  Mouth/Throat: Oropharynx is clear and moist. No oropharyngeal exudate.  Eyes:  Left pupil is 3 mm and right pupil is 2 mm and sluggish.  Neck: Normal range of motion. Neck supple.  Cardiovascular: Normal rate and regular rhythm.   Pulmonary/Chest: Effort normal and breath sounds normal.  Abdominal: Soft. Bowel sounds are normal. There is no tenderness. There is no rebound and no guarding.  Musculoskeletal: Normal range of motion. He exhibits no edema or tenderness.  Neurological:  Nonverbal. Patient was moving left upper extremity. Unable to move right upper extremity and  bilateral lower extremities. Following very simple commands.  Skin: Skin is warm and dry. Capillary refill takes less than 2 seconds. No rash noted. No erythema.  Nursing note and vitals reviewed.    ED Treatments / Results  Labs (all labs ordered are listed, but only abnormal results are displayed) Labs Reviewed  PROTIME-INR - Abnormal; Notable for the following:       Result Value   Prothrombin Time 25.7 (*)    All other components within normal limits  COMPREHENSIVE METABOLIC PANEL - Abnormal; Notable for the following:    Glucose, Bld 141 (*)    ALT 12 (*)    All other components within normal limits  I-STAT CHEM 8, ED - Abnormal; Notable for the following:    Chloride 99 (*)    Glucose, Bld 141 (*)    Calcium, Ion 1.12 (*)    All other components within normal limits  MRSA PCR SCREENING  APTT  CBC  DIFFERENTIAL  TRIGLYCERIDES  I-STAT TROPOININ, ED  CBG MONITORING, ED    EKG  EKG Interpretation None       Radiology No results found.  Procedures Procedure Name: Intubation Date/Time: 2016-05-17 1:45 PM Performed by: Ranae PalmsYELVERTON, Boyd Litaker Pre-anesthesia Checklist: Patient identified Oxygen Delivery Method: Ambu bag Preoxygenation: Pre-oxygenation with 100% oxygen Intubation Type: Rapid sequence and Cricoid Pressure applied Laryngoscope Size: 3 and Glidescope Tube size: 7.5 mm Number of attempts: 1 Placement Confirmation: ETT inserted through vocal cords under direct vision,  Positive ETCO2 and Breath sounds checked- equal and bilateral      (including critical care time)  Medications Ordered in ED Medications  ondansetron (ZOFRAN) injection 4 mg (4 mg Intravenous Given 10/04/2016 1341)  0.9 %  sodium chloride infusion ( Intravenous Stopped 10/04/2016 1419)  etomidate (AMIDATE) injection (18 mg Intravenous Given 10/04/2016 1342)  succinylcholine (ANECTINE) injection (90 mg Intravenous Given 10/04/2016 1343)   stroke: mapping our early stages of recovery book (  Does not apply Given 10/04/2016 2038)    CRITICAL CARE Performed by: Ranae PalmsYELVERTON, Sloka Volante Total critical care time: 40 minutes Critical care time was exclusive of separately billable procedures and treating other patients. Critical care was necessary to treat or prevent imminent or life-threatening deterioration. Critical care was time spent personally by me on the following activities: development of treatment plan with patient and/or surrogate as well as nursing, discussions with consultants, evaluation of patient's response to treatment, examination of patient, obtaining history from patient or surrogate, ordering and performing treatments and interventions, ordering and review of laboratory studies, ordering and review of radiographic studies, pulse oximetry and re-evaluation of patient's condition. Initial Impression / Assessment and Plan / ED Course  I have reviewed the triage vital signs and the nursing notes.  Pertinent labs & imaging results that were available during my care of the patient were reviewed by me and considered in my medical  decision making (see chart for details).  Clinical Course    Intubated for airway protection. CT head with large intracranial hemorrhage and significant shift. Neurology at bedside. Discussed with neurosurgery who will see patient in the emergency department.    Final Clinical Impressions(s) / ED Diagnoses   Final diagnoses:  Intracranial bleeding [facility](HCC)    New Prescriptions Discharge Medication List as of 30-Aug-2016  5:10 PM       Loren Raceravid Jaydan Chretien, MD 10/30/16 1750

## 2016-11-08 NOTE — Progress Notes (Signed)
Palliative Medicine RN Note: Rec'd PMT consult order for comfort care. Evaluated pt; wife, son and DIL at bedside; son is asleep.   RR=3-5. Pt non-responsive. Aggressive comfort orders are in place by attending (morphine continuous infusion 10/hour and lorazepam continuous infusion 5mg /hour with boluses available for each). Pt is relaxed with no grimacing, tension, or verbalization. PAINAD is 0.   DIL reports that in the last hour or so, Norman Dickerson's hands have relaxed. Discussed with family the unpredictable timeline for neuro patients in the last hours to days. Based on PMT RN assessment, death is likely imminent (a few hours), but as Dr Roxy Mannsster explained to family, it could be a drawn out process.  Wife and DIL are tearful and verbalized understanding. They expressed that they feel that pt is very comfortable with the current medications. They also expressed gratitude for the care rec'd in the unit and by the doctors.  Plan for f/u by PMT RN this afternoon if pt is still alive. Should pt remain stable, he may benefit from a transfer to an inpatient hospice facility. However, before discussing with family, PMT would request input from Dr Roxy Mannsster about stability for transfer and risk of herniation during trip, as PMT does not promote these transfers if there is significant risk of dying in the ambulance.  Please call for any questions that arise before the afternoon visit.  Norman ChanceMelanie G. Tykeisha Peer, RN, BSN, [facility] September 03, 2016 9:36 AM Cell [phone] 8:00-4:00 Monday-Friday Office [phone]

## 2016-11-08 NOTE — Progress Notes (Signed)
Visited with family at bedside of pt on comfort care: 3 different family members than were with pt yesterday afternoon. Provided spiritual/emotional support and prayer. They were appreciative -- as well of  hearing they could  just ask nurse to page a chaplain at any time needed throughout day.   10/27/2016 0400  Clinical Encounter Type  Visited With Family  Visit Type Follow-up;Psychological support;Spiritual support;Social support  Referral From Nurse  Spiritual Encounters  Spiritual Needs Prayer;Emotional;Grief support  Stress Factors  Patient Stress Factors Other (Comment) (end pf life, on comfort care)  Family Stress Factors Family relationships;Health changes;Loss  Advance Directives (For Healthcare)  Does patient have an advance directive? Yes

## 2016-11-08 NOTE — Progress Notes (Signed)
Patient arrived at 1303 via bed. Patient was pulesless and pronounced deceased at [id number].

## 2016-11-08 NOTE — Progress Notes (Signed)
RN wasted 140 mL of Morphine and 10 mL of Ativan with Christel MormonZee, RN

## 2016-11-08 NOTE — Discharge Summary (Signed)
Death Summary   DATE OF ADMISSION: 2016-10-20  DATE OF EXPIRATION: 11/02/2016  CAUSE OF DEATH:  1. Acute intracerebral hemorrhage, non-traumatic  SECONDARY DIAGNOSES:  1. Hypertension 2. Atrial fibrillation 3. Chronic anticoagulation for atrial fibrillation 4. Hyperlipidemia 5. GERD 6. Chronic diastolic CHF  HOSPITAL COURSE: This is a 82 year old man who presented to the hospital on 2016-10-20 following a witnessed fall. He was found to have right-sided weakness and slurred speech. He had progressive decline in his mental status en route to the hospital, requiring emergent intubation on arrival in the emergency department. Subsequent imaging revealed a massive left basal ganglia hemorrhage with intraventricular extension. This was felt to be most consistent with a hypertensive hemorrhage based upon its location, likely exacerbated by his therapeutic anticoagulation with warfarin.   After discussion with the patient's family in the emergency department, the patient was placed on comfort measures due to poor prognosis in the setting of his severe hemorrhage. He was initially admitted to the intensive care unit where comfort measures were initiated including morphine and Ativan infusions. Palliative care consultation was obtained for end-of-life care.The patient remained unresponsive but comfortable. He was being transferred to the inpatient palliative medicine unit and expired shortly after arrival to the floor. RN pronounced death and I was notified immediately. Family was present and is aware.  The patient expired on 10/29/2016 at 1304.

## 2016-11-08 NOTE — Progress Notes (Signed)
While visiting another patient on unit the staff made me aware that Mr. Wallace CullensGray had just passed. This patient was followed by other chaplains and had made me aware that patient may pass today.  I visited with patient family and offered support.  Pt. family was tearful but accepting of loss and appreciative for follow up visit and support.  Provided grief and emotional support.   10/19/2016 1400  Clinical Encounter Type  Visited With Patient and family together;Health care provider  Visit Type Initial;Spiritual support;Death  Referral From Chaplain  Spiritual Encounters  Spiritual Needs Emotional;Grief support  Stress Factors  Family Stress Factors Loss;Major life changes  Venida JarvisWatlington, Jenella Craigie, [place], Pager [phone]

## 2016-11-08 NOTE — Telephone Encounter (Signed)
Thanks for letting me know.  Please hold sympathy card so I can sign.

## 2016-11-08 NOTE — Progress Notes (Signed)
Neurology Progress No  Subjective: The patient is on comfort measures and was terminally extubated at 0124 this morning. He is comfortable on Ativan and morphine drips. Family is present at the bedside and are appreciative of his care.   Current Meds:   Current Facility-Administered Medications:  .  acetaminophen (TYLENOL) tablet 650 mg, 650 mg, Oral, Q4H PRN **OR** acetaminophen (TYLENOL) suppository 650 mg, 650 mg, Rectal, Q4H PRN, Marliss Coots, PA-C .  chlorhexidine gluconate (MEDLINE KIT) (PERIDEX) 0.12 % solution 15 mL, 15 mL, Mouth Rinse, BID, Darrel Reach, MD, 15 mL at 10/20/2016 2019 .  lidocaine (XYLOCAINE) 2 % jelly 1 application, 1 application, Urethral, Once, Darrel Reach, MD .  LORazepam (ATIVAN) 50 mg in dextrose 5 % 50 mL (1 mg/mL) infusion, 5 mg/hr, Intravenous, Continuous, Darrel Reach, MD, Last Rate: 5 mL/hr at Nov 17, 2016 0638, 5 mg/hr at 11-17-16 0638 .  LORazepam (ATIVAN) bolus via infusion 2-5 mg, 2-5 mg, Intravenous, Q15 min PRN, Darrel Reach, MD .  MEDLINE mouth rinse, 15 mL, Mouth Rinse, Q4H, Darrel Reach, MD, 15 mL at 11-17-16 0455 .  morphine 250 mg in sodium chloride 0.9 % 250 mL (1 mg/mL) infusion, 10 mg/hr, Intravenous, Continuous, Darrel Reach, MD, Last Rate: 10 mL/hr at 2016-11-17 0054, 10 mg/hr at 11-17-2016 0054 .  morphine bolus via infusion 5-20 mg, 5-20 mg, Intravenous, Q15 min PRN, Darrel Reach, MD .  propofol (DIPRIVAN) 1000 MG/100ML infusion, 5-80 mcg/kg/min, Intravenous, Titrated, Julianne Rice, MD, Stopped at 10/22/2016 2000  Objective:  Temp:  [96.8 F (36 C)-97.9 F (36.6 C)] 97.9 F (36.6 C) (11/15 2000) Pulse Rate:  [57-96] 91 (11/16 0700) Resp:  [16-22] 19 (11/16 0700) BP: (104-237)/(54-129) 104/54 (11/16 0600) SpO2:  [96 %-100 %] 96 % (11/16 0700) FiO2 (%):  [100 %] 100 % (11/15 2313)  General: Elderly male lying in bed, unresponsive to verbal and tactile stimulation. He is on morphine and Ativan for  comfort.  CV: Regular, no murmur.  Lungs: CTAB. Respirations slow but comfortable.   Neuro: Deferred  Labs: Lab Results  Component Value Date   WBC 7.7 10/27/2016   HGB 13.3 10/29/2016   HCT 39.0 10/11/2016   PLT 196 10/11/2016   GLUCOSE 141 (H) 10/09/2016   CHOL 162 10/22/2012   TRIG 74 10/09/2016   HDL 47 10/22/2012   LDLCALC 105 (H) 10/22/2012   ALT 12 (L) 10/27/2016   AST 25 11/05/2016   NA 137 10/16/2016   K 3.7 10/13/2016   CL 99 (L) 10/30/2016   CREATININE 0.90 10/13/2016   BUN 16 11/03/2016   CO2 24 10/16/2016   INR 2.29 11/02/2016   HGBA1C 5.2 10/22/2012   CBC Latest Ref Rng & Units 10/30/2016 10/17/2016 10/15/2016  WBC 4.0 - 10.5 K/uL - 7.7 6.7  Hemoglobin 13.0 - 17.0 g/dL 13.3 13.3 14.4  Hematocrit 39.0 - 52.0 % 39.0 39.4 41.3  Platelets 150 - 400 K/uL - 196 162    Lab Results  Component Value Date   HGBA1C 5.2 10/22/2012   Lab Results  Component Value Date   ALT 12 (L) 10/09/2016   AST 25 11/04/2016   ALKPHOS 45 11/05/2016   BILITOT 0.6 10/15/2016    A/P:   1. ICH: He has suffered a massive L basal ganglia hemorrhage with diffuse intraventricular extension. This is not a survivable event. He is comfortable following terminal extubation earlier this morning and will continue morphine and Ativan. Palliative care was consulted for assistance  with end-of-life care and I appreciate their assistance.   I spoke with the patient's wife, son, and daughter-in-law at the bedside. They were apprised that palliative care will be seeing him today. I shared that he is not expected to survive long but that his passing may not be immediate. They were assured that we will make every effort to ensure the patient's comfort and they are encouraged to notify his RN if they feel that he is showing any signs of discomfort.  I anticipate that he will be transferred out of the ICU at some point today if it appears that his passing is not imminent.  I made myself available for any  questions or concerns that may arise.   Melba Coon, MD [facility]

## 2016-11-08 NOTE — Progress Notes (Signed)
OT Cancellation Note  Patient Details Name: Norman ChromanHugh A Dickerson MRN: [id number] DOB: 03-14-1920   Cancelled Treatment:    Reason Eval/Treat Not Completed: Noted pt transitioning to comfort care. OT will sign off.   [facility] HospitalWARD,HILLARY  [place] Retana, OTR/L  [phone] 10/20/2016 11/04/2016, 9:35 AM

## 2016-11-08 NOTE — Progress Notes (Signed)
SLP Cancellation Note  Patient Details Name: Glenda ChromanHugh A Munger MRN: [id number] DOB: [birth year]   Cancelled treatment:        Intubated. Will follow along.   Royce MacadamiaLitaker, Najah Liverman Willis 21-Nov-2016, 8:02 AM Breck CoonsLisa Willis Lonell FaceLitaker M.Ed [employer]-SLP Pager [phone]

## 2016-11-08 NOTE — Procedures (Signed)
Extubation Procedure Note  Patient Details:   Name: Norman Dickerson DOB: Aug 17, 1920 MRN: [id number]   Airway Documentation:  Airway 7.5 mm (Active)  Secured at (cm) 26 cm 09/11/2016 11:13 PM  Measured From Lips 09/11/2016 11:13 PM  Secured Location Center 09/11/2016 11:13 PM  Secured By [employer] Tube Holder 09/11/2016 11:13 PM  Tube Holder Repositioned Yes 09/11/2016 11:13 PM  Site Condition Dry 09/11/2016 11:13 PM    Evaluation  O2 sats: currently acceptable Complications: No apparent complications Patient did tolerate procedure well. Bilateral Breath Sounds: Clear   No   Patient terminally extubated and placed on 1LNC.  Norman Dickerson, Norman Dickerson 11/03/2016, 1:24 AM

## 2016-11-08 NOTE — Progress Notes (Signed)
Nutrition Brief Note  Chart reviewed. Pt now transitioning to comfort care. No nutrition interventions warranted at this time. Please consult RD as needed.  Rosemarie AxKate Yahmir Sokolov Dietetic Intern Pager Number: [phone]

## 2016-11-08 DEATH — deceased

## 2018-01-11 IMAGING — CR DG HIP (WITH OR WITHOUT PELVIS) 2-3V*R*
3 series · 3 of 3 positions shown · non-contrast
Comparison: 08/04/2015

CLINICAL DATA: Fall this morning onto right hip. Initial encounter.

EXAM:
DG HIP (WITH OR WITHOUT PELVIS) 2-3V RIGHT

[pelvis ap]
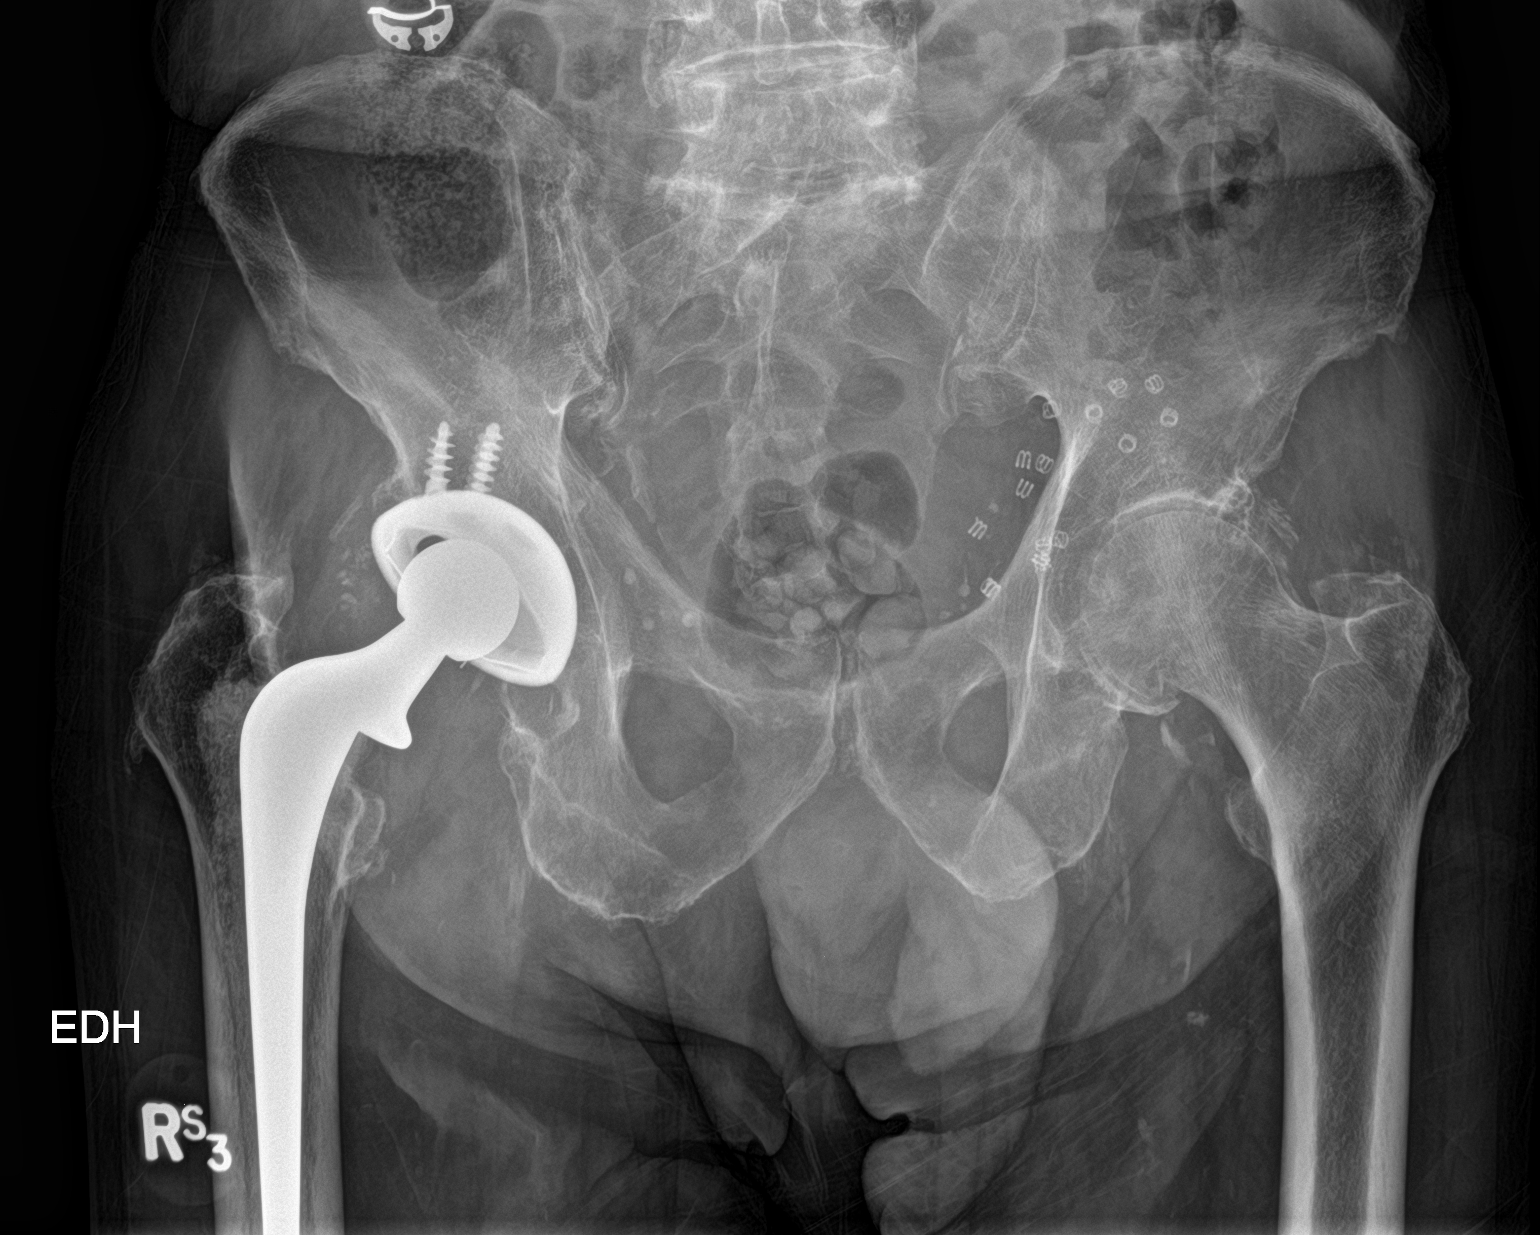

[hip ap]
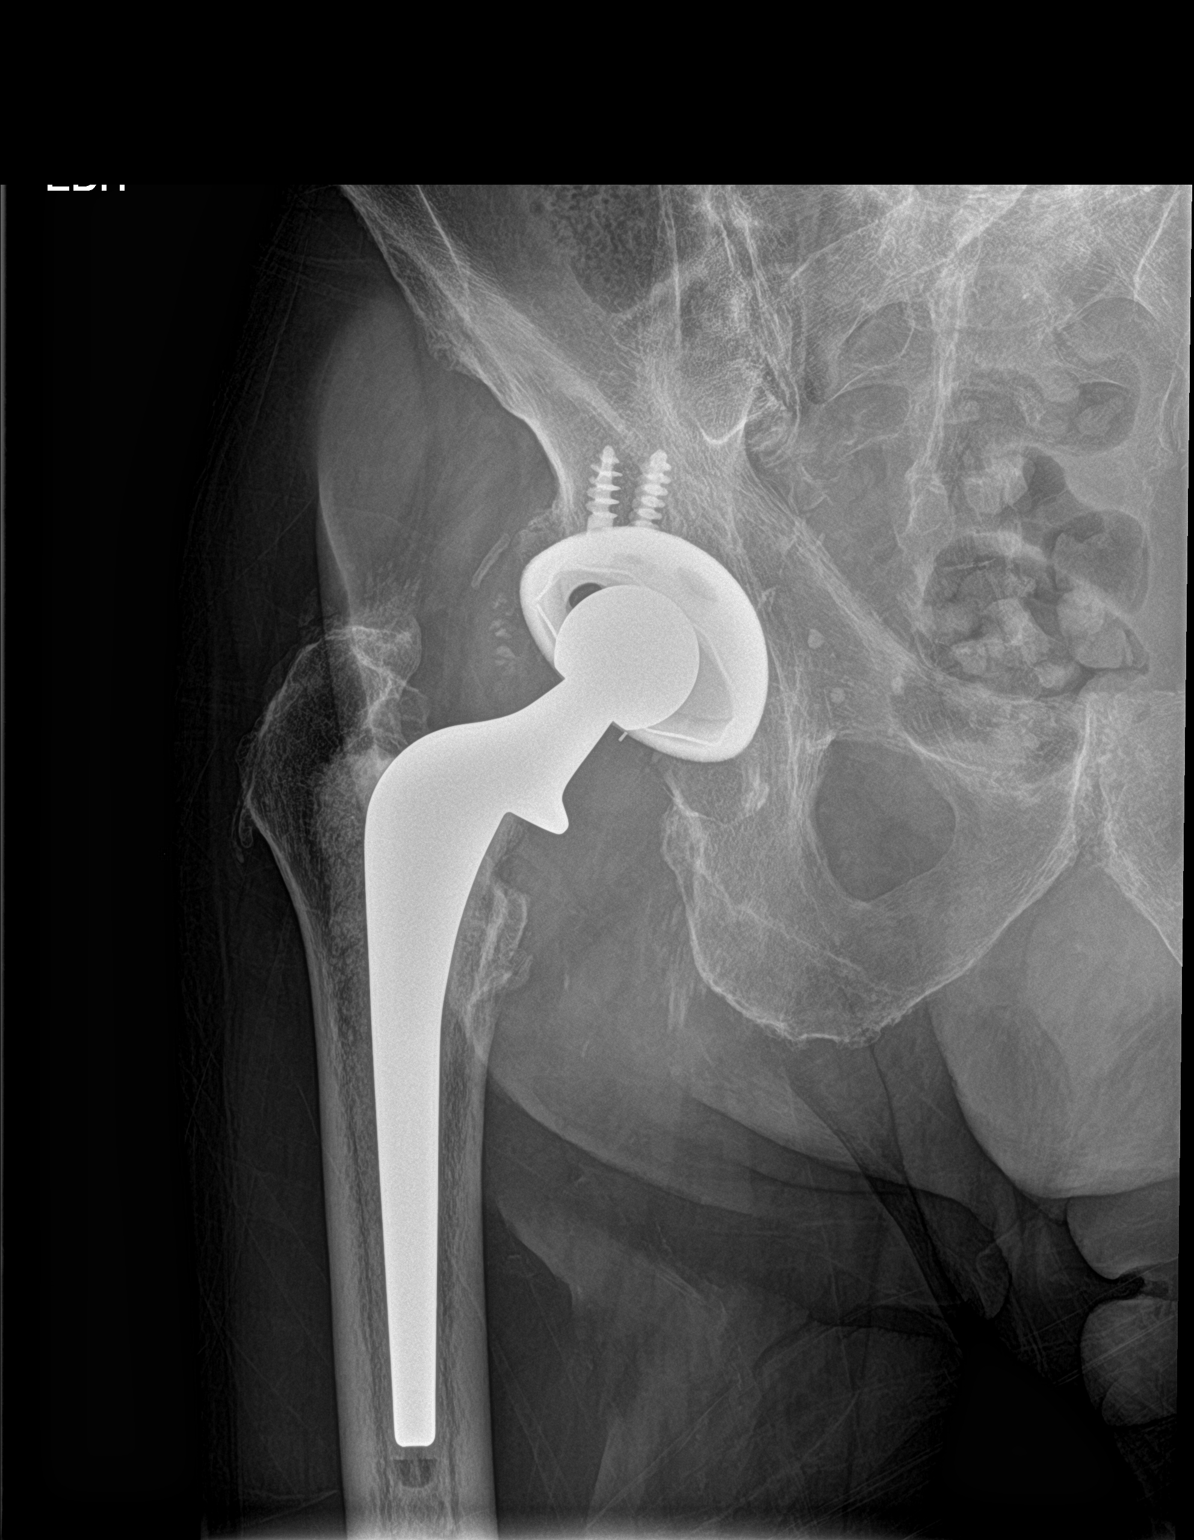

[hip lat]
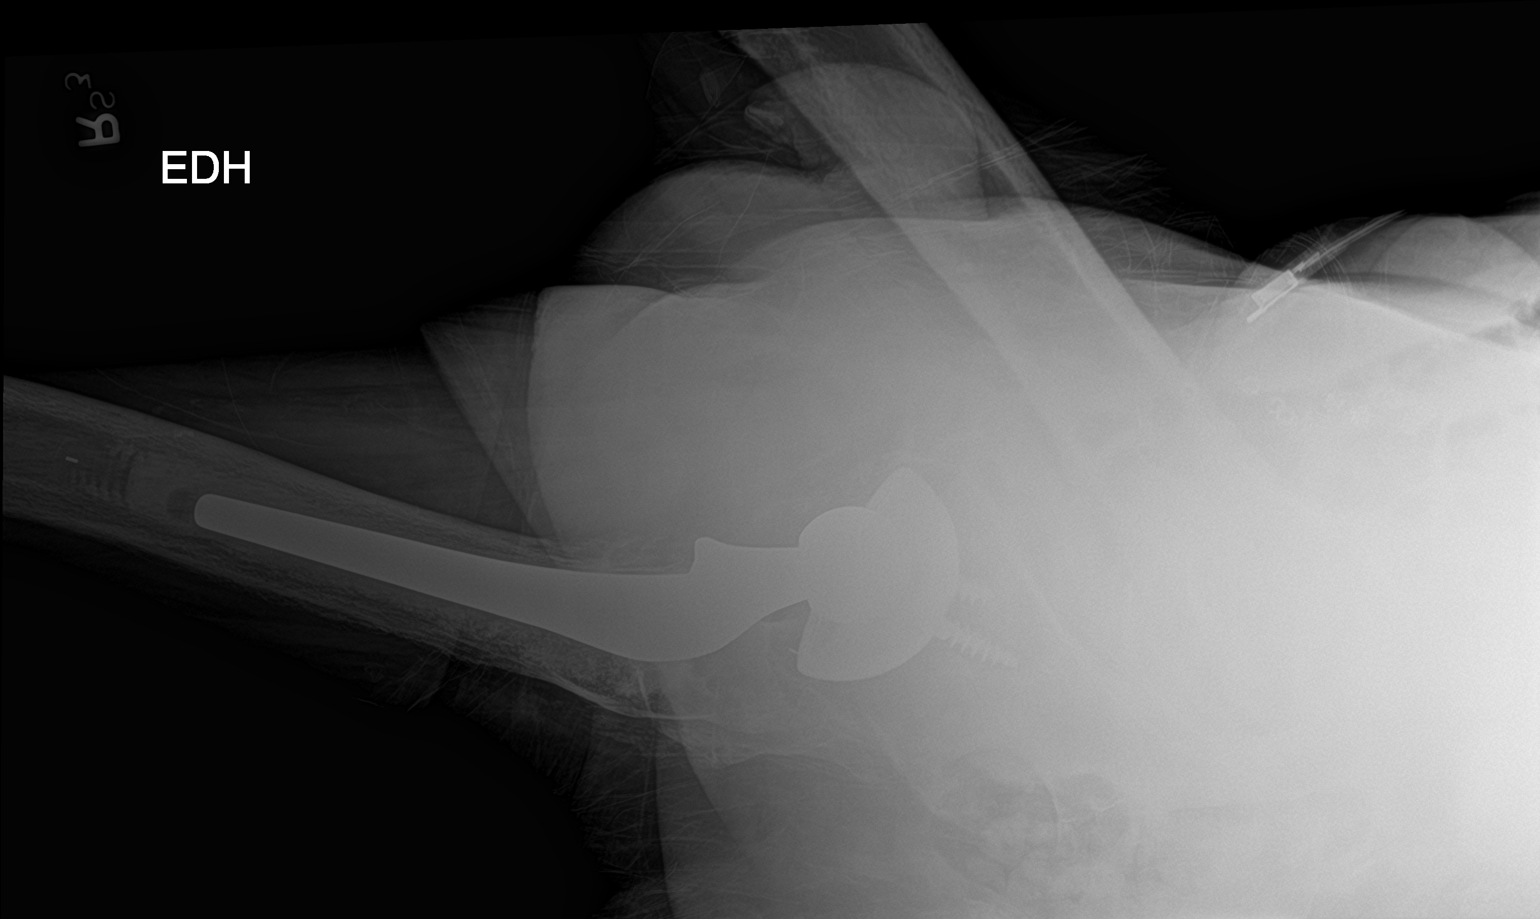

[3 of 3 positions shown; findings below may reference images not displayed]

FINDINGS: Total right hip arthroplasty is located. No periprosthetic fracture
or lucency.

Stable appearance of the pelvic ring. Left hip osteoarthritis with
chondrocalcinosis. Osteopenia and atherosclerosis.
IMPRESSION: No acute finding.

## 2019-12-01 NOTE — Telephone Encounter (Signed)
Created in error
